# Patient Record
Sex: Female | Born: 1945 | Race: White | Hispanic: No | Smoking: Never smoker
Health system: Southern US, Community
[De-identification: ages and names within clinical notes are randomized; demographics above are authoritative.]

## PROBLEM LIST (undated history)

## (undated) DIAGNOSIS — M8580 Other specified disorders of bone density and structure, unspecified site: Secondary | ICD-10-CM

## (undated) DIAGNOSIS — C801 Malignant (primary) neoplasm, unspecified: Secondary | ICD-10-CM

## (undated) DIAGNOSIS — M1990 Unspecified osteoarthritis, unspecified site: Secondary | ICD-10-CM

## (undated) DIAGNOSIS — C50919 Malignant neoplasm of unspecified site of unspecified female breast: Secondary | ICD-10-CM

## (undated) DIAGNOSIS — K5790 Diverticulosis of intestine, part unspecified, without perforation or abscess without bleeding: Secondary | ICD-10-CM

## (undated) HISTORY — PX: ABDOMINAL HYSTERECTOMY: SHX81

## (undated) HISTORY — PX: OTHER SURGICAL HISTORY: SHX169

## (undated) HISTORY — DX: Malignant (primary) neoplasm, unspecified: C80.1

---

## 1997-12-03 ENCOUNTER — Ambulatory Visit (HOSPITAL_BASED_OUTPATIENT_CLINIC_OR_DEPARTMENT_OTHER): Admission: RE | Admit: 1997-12-03 | Discharge: 1997-12-03 | Payer: Self-pay | Admitting: Surgery

## 2006-05-08 HISTORY — PX: BREAST LUMPECTOMY: SHX2

## 2006-05-17 ENCOUNTER — Encounter: Admission: RE | Admit: 2006-05-17 | Discharge: 2006-05-17 | Payer: Self-pay | Admitting: Obstetrics and Gynecology

## 2006-05-18 ENCOUNTER — Encounter: Admission: RE | Admit: 2006-05-18 | Discharge: 2006-05-18 | Payer: Self-pay | Admitting: Obstetrics and Gynecology

## 2006-05-25 ENCOUNTER — Encounter (INDEPENDENT_AMBULATORY_CARE_PROVIDER_SITE_OTHER): Payer: Self-pay | Admitting: Surgery

## 2006-05-25 ENCOUNTER — Encounter (INDEPENDENT_AMBULATORY_CARE_PROVIDER_SITE_OTHER): Payer: Self-pay | Admitting: *Deleted

## 2006-05-25 ENCOUNTER — Ambulatory Visit (HOSPITAL_COMMUNITY): Admission: RE | Admit: 2006-05-25 | Discharge: 2006-05-25 | Payer: Self-pay | Admitting: Surgery

## 2006-05-25 ENCOUNTER — Encounter: Admission: RE | Admit: 2006-05-25 | Discharge: 2006-05-25 | Payer: Self-pay | Admitting: Surgery

## 2006-06-08 ENCOUNTER — Ambulatory Visit (HOSPITAL_BASED_OUTPATIENT_CLINIC_OR_DEPARTMENT_OTHER): Admission: RE | Admit: 2006-06-08 | Discharge: 2006-06-08 | Payer: Self-pay | Admitting: Surgery

## 2006-06-08 ENCOUNTER — Encounter (INDEPENDENT_AMBULATORY_CARE_PROVIDER_SITE_OTHER): Payer: Self-pay | Admitting: Specialist

## 2006-06-14 ENCOUNTER — Ambulatory Visit: Payer: Self-pay | Admitting: Oncology

## 2006-06-18 ENCOUNTER — Ambulatory Visit: Admission: RE | Admit: 2006-06-18 | Discharge: 2006-08-22 | Payer: Self-pay | Admitting: Radiation Oncology

## 2006-07-03 LAB — CBC WITH DIFFERENTIAL/PLATELET
EOS%: 0.5 % (ref 0.0–7.0)
MCH: 32.1 pg (ref 26.0–34.0)
MCV: 91.7 fL (ref 81.0–101.0)
MONO%: 13.4 % — ABNORMAL HIGH (ref 0.0–13.0)
RBC: 4.45 10*6/uL (ref 3.70–5.32)
RDW: 13.1 % (ref 11.3–14.5)

## 2006-07-03 LAB — COMPREHENSIVE METABOLIC PANEL
AST: 21 U/L (ref 0–37)
Albumin: 4.4 g/dL (ref 3.5–5.2)
Alkaline Phosphatase: 70 U/L (ref 39–117)
BUN: 13 mg/dL (ref 6–23)
Potassium: 4.4 mEq/L (ref 3.5–5.3)
Sodium: 142 mEq/L (ref 135–145)
Total Bilirubin: 0.4 mg/dL (ref 0.3–1.2)
Total Protein: 7 g/dL (ref 6.0–8.3)

## 2006-08-28 ENCOUNTER — Ambulatory Visit: Payer: Self-pay | Admitting: Oncology

## 2006-12-11 ENCOUNTER — Ambulatory Visit: Payer: Self-pay | Admitting: Oncology

## 2006-12-14 LAB — CBC WITH DIFFERENTIAL/PLATELET
Eosinophils Absolute: 0 10*3/uL (ref 0.0–0.5)
HCT: 39.4 % (ref 34.8–46.6)
LYMPH%: 17.2 % (ref 14.0–48.0)
MCV: 93.5 fL (ref 81.0–101.0)
MONO%: 10.1 % (ref 0.0–13.0)
NEUT#: 3.6 10*3/uL (ref 1.5–6.5)
NEUT%: 71.6 % (ref 39.6–76.8)
Platelets: 197 10*3/uL (ref 145–400)
RBC: 4.22 10*6/uL (ref 3.70–5.32)

## 2006-12-14 LAB — LACTATE DEHYDROGENASE: LDH: 152 U/L (ref 94–250)

## 2006-12-14 LAB — COMPREHENSIVE METABOLIC PANEL
Alkaline Phosphatase: 49 U/L (ref 39–117)
BUN: 17 mg/dL (ref 6–23)
CO2: 23 mEq/L (ref 19–32)
Creatinine, Ser: 0.71 mg/dL (ref 0.40–1.20)
Glucose, Bld: 96 mg/dL (ref 70–99)
Sodium: 142 mEq/L (ref 135–145)
Total Bilirubin: 0.4 mg/dL (ref 0.3–1.2)

## 2007-01-28 ENCOUNTER — Encounter: Admission: RE | Admit: 2007-01-28 | Discharge: 2007-01-28 | Payer: Self-pay | Admitting: Surgery

## 2007-04-11 ENCOUNTER — Encounter: Admission: RE | Admit: 2007-04-11 | Discharge: 2007-04-11 | Payer: Self-pay | Admitting: Surgery

## 2007-09-09 ENCOUNTER — Ambulatory Visit: Payer: Self-pay | Admitting: Oncology

## 2007-09-11 LAB — CBC WITH DIFFERENTIAL/PLATELET
BASO%: 0.2 % (ref 0.0–2.0)
LYMPH%: 17.1 % (ref 14.0–48.0)
MCHC: 34.3 g/dL (ref 32.0–36.0)
MCV: 93.1 fL (ref 81.0–101.0)
MONO%: 7.8 % (ref 0.0–13.0)
Platelets: 202 10*3/uL (ref 145–400)
RBC: 4.28 10*6/uL (ref 3.70–5.32)

## 2007-09-11 LAB — COMPREHENSIVE METABOLIC PANEL
ALT: 15 U/L (ref 0–35)
Alkaline Phosphatase: 47 U/L (ref 39–117)
Sodium: 143 mEq/L (ref 135–145)
Total Bilirubin: 0.3 mg/dL (ref 0.3–1.2)
Total Protein: 6.8 g/dL (ref 6.0–8.3)

## 2008-01-14 ENCOUNTER — Encounter: Admission: RE | Admit: 2008-01-14 | Discharge: 2008-01-14 | Payer: Self-pay | Admitting: Family Medicine

## 2008-03-25 ENCOUNTER — Ambulatory Visit: Payer: Self-pay | Admitting: Internal Medicine

## 2008-04-13 ENCOUNTER — Encounter: Admission: RE | Admit: 2008-04-13 | Discharge: 2008-04-13 | Payer: Self-pay | Admitting: Surgery

## 2008-04-15 ENCOUNTER — Ambulatory Visit: Payer: Self-pay | Admitting: Internal Medicine

## 2008-09-23 ENCOUNTER — Ambulatory Visit: Payer: Self-pay | Admitting: Oncology

## 2008-09-25 LAB — CBC WITH DIFFERENTIAL/PLATELET
BASO%: 0.3 % (ref 0.0–2.0)
Basophils Absolute: 0 10*3/uL (ref 0.0–0.1)
LYMPH%: 23 % (ref 14.0–49.7)
MCH: 31.9 pg (ref 25.1–34.0)
MONO#: 0.5 10*3/uL (ref 0.1–0.9)
MONO%: 8.1 % (ref 0.0–14.0)
RDW: 12.8 % (ref 11.2–14.5)

## 2008-09-28 LAB — LACTATE DEHYDROGENASE: LDH: 142 U/L (ref 94–250)

## 2008-09-28 LAB — COMPREHENSIVE METABOLIC PANEL
ALT: 12 U/L (ref 0–35)
AST: 17 U/L (ref 0–37)
Albumin: 4 g/dL (ref 3.5–5.2)
BUN: 20 mg/dL (ref 6–23)
Calcium: 9.3 mg/dL (ref 8.4–10.5)
Chloride: 107 mEq/L (ref 96–112)
Potassium: 4 mEq/L (ref 3.5–5.3)

## 2008-09-28 LAB — VITAMIN D 25 HYDROXY (VIT D DEFICIENCY, FRACTURES): Vit D, 25-Hydroxy: 56 ng/mL (ref 30–89)

## 2009-04-14 ENCOUNTER — Encounter: Admission: RE | Admit: 2009-04-14 | Discharge: 2009-04-14 | Payer: Self-pay | Admitting: Oncology

## 2009-09-09 ENCOUNTER — Ambulatory Visit: Payer: Self-pay | Admitting: Oncology

## 2009-09-10 LAB — COMPREHENSIVE METABOLIC PANEL
ALT: 14 U/L (ref 0–35)
Alkaline Phosphatase: 51 U/L (ref 39–117)
BUN: 18 mg/dL (ref 6–23)
Calcium: 9 mg/dL (ref 8.4–10.5)
Chloride: 105 mEq/L (ref 96–112)
Creatinine, Ser: 0.71 mg/dL (ref 0.40–1.20)
Glucose, Bld: 130 mg/dL — ABNORMAL HIGH (ref 70–99)
Sodium: 142 mEq/L (ref 135–145)
Total Bilirubin: 0.3 mg/dL (ref 0.3–1.2)

## 2009-09-10 LAB — CBC WITH DIFFERENTIAL/PLATELET
EOS%: 0.4 % (ref 0.0–7.0)
HGB: 13.4 g/dL (ref 11.6–15.9)
MCH: 31.9 pg (ref 25.1–34.0)
MCHC: 33.8 g/dL (ref 31.5–36.0)
MCV: 94.4 fL (ref 79.5–101.0)
MONO%: 6.2 % (ref 0.0–14.0)
NEUT#: 5.2 10*3/uL (ref 1.5–6.5)
lymph#: 1.3 10*3/uL (ref 0.9–3.3)

## 2010-04-15 ENCOUNTER — Encounter
Admission: RE | Admit: 2010-04-15 | Discharge: 2010-04-15 | Payer: Self-pay | Source: Home / Self Care | Attending: Oncology | Admitting: Oncology

## 2010-05-29 ENCOUNTER — Encounter: Payer: Self-pay | Admitting: Oncology

## 2010-05-29 ENCOUNTER — Encounter: Payer: Self-pay | Admitting: Obstetrics and Gynecology

## 2010-06-09 ENCOUNTER — Emergency Department (HOSPITAL_COMMUNITY): Payer: BC Managed Care – PPO

## 2010-06-09 ENCOUNTER — Emergency Department (HOSPITAL_COMMUNITY)
Admission: EM | Admit: 2010-06-09 | Discharge: 2010-06-09 | Disposition: A | Discharge status: Home / Self Care | Payer: BC Managed Care – PPO | Attending: Emergency Medicine | Admitting: Emergency Medicine

## 2010-06-09 DIAGNOSIS — W108XXA Fall (on) (from) other stairs and steps, initial encounter: Secondary | ICD-10-CM | POA: Insufficient documentation

## 2010-06-09 DIAGNOSIS — S42293A Other displaced fracture of upper end of unspecified humerus, initial encounter for closed fracture: Secondary | ICD-10-CM | POA: Insufficient documentation

## 2010-06-09 DIAGNOSIS — M79609 Pain in unspecified limb: Secondary | ICD-10-CM | POA: Insufficient documentation

## 2010-06-09 DIAGNOSIS — Y92009 Unspecified place in unspecified non-institutional (private) residence as the place of occurrence of the external cause: Secondary | ICD-10-CM | POA: Insufficient documentation

## 2010-09-03 ENCOUNTER — Encounter (INDEPENDENT_AMBULATORY_CARE_PROVIDER_SITE_OTHER): Payer: Self-pay | Admitting: Surgery

## 2010-09-23 ENCOUNTER — Other Ambulatory Visit: Payer: Self-pay | Admitting: Oncology

## 2010-09-23 ENCOUNTER — Encounter (HOSPITAL_BASED_OUTPATIENT_CLINIC_OR_DEPARTMENT_OTHER): Payer: BC Managed Care – PPO | Admitting: Oncology

## 2010-09-23 DIAGNOSIS — Z170 Estrogen receptor positive status [ER+]: Secondary | ICD-10-CM

## 2010-09-23 DIAGNOSIS — D0590 Unspecified type of carcinoma in situ of unspecified breast: Secondary | ICD-10-CM

## 2010-09-23 LAB — CBC WITH DIFFERENTIAL/PLATELET
BASO%: 0.5 % (ref 0.0–2.0)
EOS%: 0.4 % (ref 0.0–7.0)
Eosinophils Absolute: 0 10*3/uL (ref 0.0–0.5)
HGB: 13.4 g/dL (ref 11.6–15.9)
MCHC: 34.4 g/dL (ref 31.5–36.0)
MCV: 91.5 fL (ref 79.5–101.0)
MONO#: 0.5 10*3/uL (ref 0.1–0.9)
NEUT#: 4.5 10*3/uL (ref 1.5–6.5)
Platelets: 174 10*3/uL (ref 145–400)
RBC: 4.27 10*6/uL (ref 3.70–5.45)
RDW: 13.4 % (ref 11.2–14.5)
lymph#: 1.3 10*3/uL (ref 0.9–3.3)

## 2010-09-23 NOTE — Op Note (Signed)
NAMEAUSTYN, Samantha Pineda NO.:  [id number]   MEDICAL RECORD NO.:  [id number]          PATIENT TYPE:  AMB   LOCATION:  DSC                          FACILITY:  [facility]   PHYSICIAN:  Velora Heckler, MD      DATE OF BIRTH:  05/30/1945   DATE OF PROCEDURE:  06/08/2006  DATE OF DISCHARGE:                               OPERATIVE REPORT   PREOPERATIVE DIAGNOSIS:  Ductal carcinoma in situ, right breast.   POSTOPERATIVE DIAGNOSIS:  Ductal carcinoma in situ, right breast.   PROCEDURE:  Re-excision, right breast biopsy site (right breast  lumpectomy).   SURGEON:  Velora Heckler, MD, FACS   ANESTHESIA:  General.   ESTIMATED BLOOD LOSS:  Minimal.   PREPARATION:  Betadine.   COMPLICATIONS:  None.   INDICATIONS:  The patient is a 65 year old white female who recently  underwent wire-localized excisional biopsy for abnormality on mammogram.  Pathology reveals ductal carcinoma in situ.  Margins were positive.  The  patient now returns for re-excision.   BODY OF REPORT:  The procedure is done in OR #3 at the [facility].  The patient is brought to the operating room and placed  in a supine position on the operating room table.  Following  administration of general anesthesia, the patient is prepped and draped  in the usual strict aseptic fashion.  After ascertaining that an  adequate level of anesthesia had been obtained, the patient's previous  right breast upper outer quadrant incision is excised with a #15 blade.  Hemostasis is obtained with the electrocautery.  Skin edges are  discarded.  Seroma cavity is opened and evacuated.  Using the  electrocautery, the deep margin of the cavity is excised off the  underlying pectoralis major muscle, including the muscle fascia.  This  was submitted as a separate specimen labeled as deep margin of seroma  cavity.  Next using the electrocautery, a rim of breast tissue is  excised beginning medially and extending around  the seroma cavity 270  degrees from medial to inferior to lateral.  Approximately 1.5-2 cm of  tissue are taken along the rim.  This is marked after excision with  sutures, with a single short suture marking the medial margin, a double  short suture marking the inferior margin, and a small single suture  marking the lateral margin.  No remaining breast tissue exists superior  to the seroma cavity.  Margins of the seroma cavity are then marked with  medium Ligaclips, including two clips located centrally at the site of  previous needle-localized biopsy.  Good hemostasis is achieved.  Subcutaneous tissues are closed with interrupted 3-0 Vicryl sutures.  Skin is closed with a  running 4-0 Vicryl subcuticular suture.  Wound is washed and dried and  Benzoin and Steri-Strips were applied.  Sterile fluff gauze dressings  are applied.  The patient is awakened from anesthesia and brought to the  recovery room in stable condition.  The patient tolerated the procedure  well.      Velora Heckler, MD  Electronically Signed  TMG/MEDQ  D:  06/08/2006  T:  06/08/2006  Job:  [id number]   cc:   S. Kyra Manges, M.D.  Caron L. Kearney Hard, M.D.

## 2010-09-24 LAB — LACTATE DEHYDROGENASE: LDH: 159 U/L (ref 94–250)

## 2010-09-24 LAB — COMPREHENSIVE METABOLIC PANEL
ALT: 15 U/L (ref 0–35)
Alkaline Phosphatase: 60 U/L (ref 39–117)
CO2: 22 mEq/L (ref 19–32)
Calcium: 9.1 mg/dL (ref 8.4–10.5)
Creatinine, Ser: 0.64 mg/dL (ref 0.40–1.20)
Total Bilirubin: 0.2 mg/dL — ABNORMAL LOW (ref 0.3–1.2)
Total Protein: 6.2 g/dL (ref 6.0–8.3)

## 2010-09-30 ENCOUNTER — Encounter (INDEPENDENT_AMBULATORY_CARE_PROVIDER_SITE_OTHER): Payer: Self-pay | Admitting: Surgery

## 2010-09-30 ENCOUNTER — Encounter (HOSPITAL_BASED_OUTPATIENT_CLINIC_OR_DEPARTMENT_OTHER): Payer: BC Managed Care – PPO | Admitting: Oncology

## 2010-09-30 DIAGNOSIS — D0590 Unspecified type of carcinoma in situ of unspecified breast: Secondary | ICD-10-CM

## 2010-09-30 DIAGNOSIS — Z170 Estrogen receptor positive status [ER+]: Secondary | ICD-10-CM

## 2010-10-04 ENCOUNTER — Other Ambulatory Visit: Payer: Self-pay | Admitting: Oncology

## 2010-10-04 DIAGNOSIS — Z853 Personal history of malignant neoplasm of breast: Secondary | ICD-10-CM

## 2011-01-12 ENCOUNTER — Other Ambulatory Visit: Payer: Self-pay | Admitting: Family Medicine

## 2011-01-12 DIAGNOSIS — M8580 Other specified disorders of bone density and structure, unspecified site: Secondary | ICD-10-CM

## 2011-01-13 ENCOUNTER — Other Ambulatory Visit: Payer: Self-pay | Admitting: Podiatrist

## 2011-01-13 DIAGNOSIS — M79671 Pain in right foot: Secondary | ICD-10-CM

## 2011-01-20 ENCOUNTER — Ambulatory Visit
Admission: RE | Admit: 2011-01-20 | Discharge: 2011-01-20 | Disposition: A | Discharge status: Home / Self Care | Payer: BC Managed Care – PPO | Source: Ambulatory Visit | Attending: Podiatrist | Admitting: Podiatrist

## 2011-01-20 DIAGNOSIS — M79671 Pain in right foot: Secondary | ICD-10-CM

## 2011-01-20 MED ORDER — GADOBENATE DIMEGLUMINE 529 MG/ML IV SOLN
5.0000 mL | Freq: Once | INTRAVENOUS | Status: AC | PRN
  Administered 2011-01-20: 5 mL via INTRAVENOUS

## 2011-03-01 ENCOUNTER — Ambulatory Visit
Admission: RE | Admit: 2011-03-01 | Discharge: 2011-03-01 | Disposition: A | Discharge status: Home / Self Care | Payer: BC Managed Care – PPO | Source: Ambulatory Visit | Attending: Family Medicine | Admitting: Family Medicine

## 2011-03-01 DIAGNOSIS — M8580 Other specified disorders of bone density and structure, unspecified site: Secondary | ICD-10-CM

## 2011-04-17 ENCOUNTER — Encounter (INDEPENDENT_AMBULATORY_CARE_PROVIDER_SITE_OTHER): Payer: Self-pay | Admitting: Surgery

## 2011-04-17 ENCOUNTER — Ambulatory Visit
Admission: RE | Admit: 2011-04-17 | Discharge: 2011-04-17 | Disposition: A | Discharge status: Home / Self Care | Payer: BC Managed Care – PPO | Source: Ambulatory Visit | Attending: Oncology | Admitting: Oncology

## 2011-04-17 ENCOUNTER — Encounter (INDEPENDENT_AMBULATORY_CARE_PROVIDER_SITE_OTHER): Payer: Self-pay

## 2011-04-17 DIAGNOSIS — Z853 Personal history of malignant neoplasm of breast: Secondary | ICD-10-CM

## 2011-04-18 ENCOUNTER — Ambulatory Visit (INDEPENDENT_AMBULATORY_CARE_PROVIDER_SITE_OTHER): Payer: BC Managed Care – PPO | Admitting: Surgery

## 2011-04-18 ENCOUNTER — Encounter (INDEPENDENT_AMBULATORY_CARE_PROVIDER_SITE_OTHER): Payer: Self-pay | Admitting: Surgery

## 2011-04-18 VITALS — BP 116/74 | HR 60 | Temp 98.4°F | Resp 18 | Ht 63.0 in | Wt 106.0 lb

## 2011-04-18 DIAGNOSIS — D0590 Unspecified type of carcinoma in situ of unspecified breast: Secondary | ICD-10-CM

## 2011-04-18 DIAGNOSIS — D0510 Intraductal carcinoma in situ of unspecified breast: Secondary | ICD-10-CM

## 2011-04-18 NOTE — Progress Notes (Signed)
Visit Diagnoses: 1. DCIS (ductal carcinoma in situ), right breast     HISTORY: Patient is a 65 year old white female with a history of ductal carcinoma in situ in the right breast. She underwent partial mastectomy in 2008. A followup mammogram was performed today. This shows no sign of recurrent disease. There were no worrisome findings for malignancy.   PERTINENT REVIEW OF SYSTEMS: Patient denies any changes in self-examination. No new masses. No nipple discharge. No lymphadenopathy.   EXAM: HEENT: normocephalic; pupils equal and reactive; sclerae clear; dentition good; mucous membranes moist NECK:  No nodules; symmetric on extension; no palpable anterior or posterior cervical lymphadenopathy; no supraclavicular masses; no tenderness CHEST: clear to auscultation bilaterally without rales, rhonchi, or wheezes CARDIAC: regular rate and rhythm without significant murmur; peripheral pulses are full BREAST: Right breast with normal nipple areolar complex. Surgical incision in the upper outer quadrant as well healed. There is mild to moderate tenderness at the site. There are no discrete or dominant masses. Axilla is free of adenopathy. Left breast shows normal nipple areolar complex. No nipple drainage. Breast parenchyma is soft and uniform without discrete or dominant mass. Axilla is free of adenopathy. EXT:  non-tender without edema; no deformity NEURO: no gross focal deficits; no sign of tremor   IMPRESSION: Personal history of ductal carcinoma in situ, right breast, no evidence of recurrent disease   PLAN: Patient will continue monthly self-examination. She will see her oncologist in the spring. She is on tamoxifen and will continue that until May of 2013. She will return for followup mammogram in one year. She will return for followup breast exam at that time.  Velora Heckler, MD, FACS General & Endocrine Surgery [facility]

## 2011-09-15 ENCOUNTER — Other Ambulatory Visit: Payer: Self-pay | Admitting: Oncology

## 2011-09-15 DIAGNOSIS — C50919 Malignant neoplasm of unspecified site of unspecified female breast: Secondary | ICD-10-CM

## 2011-09-20 ENCOUNTER — Telehealth: Payer: Self-pay | Admitting: *Deleted

## 2011-09-20 NOTE — Telephone Encounter (Signed)
patient confirmed over the phone the new date and time 

## 2011-09-28 ENCOUNTER — Other Ambulatory Visit (HOSPITAL_BASED_OUTPATIENT_CLINIC_OR_DEPARTMENT_OTHER): Payer: BC Managed Care – PPO | Admitting: Lab

## 2011-09-28 DIAGNOSIS — Z170 Estrogen receptor positive status [ER+]: Secondary | ICD-10-CM

## 2011-09-28 DIAGNOSIS — D0590 Unspecified type of carcinoma in situ of unspecified breast: Secondary | ICD-10-CM

## 2011-09-28 LAB — CBC WITH DIFFERENTIAL/PLATELET
EOS%: 0.3 % (ref 0.0–7.0)
Eosinophils Absolute: 0 10*3/uL (ref 0.0–0.5)
MCV: 92.7 fL (ref 79.5–101.0)
MONO%: 6.7 % (ref 0.0–14.0)
NEUT#: 5.7 10*3/uL (ref 1.5–6.5)
RBC: 4.17 10*6/uL (ref 3.70–5.45)
RDW: 13.4 % (ref 11.2–14.5)
lymph#: 1.1 10*3/uL (ref 0.9–3.3)
nRBC: 0 % (ref 0–0)

## 2011-09-29 LAB — COMPREHENSIVE METABOLIC PANEL
ALT: 12 U/L (ref 0–35)
Alkaline Phosphatase: 48 U/L (ref 39–117)
CO2: 27 mEq/L (ref 19–32)
Potassium: 3.9 mEq/L (ref 3.5–5.3)
Sodium: 141 mEq/L (ref 135–145)
Total Bilirubin: 0.3 mg/dL (ref 0.3–1.2)
Total Protein: 6.3 g/dL (ref 6.0–8.3)

## 2011-10-05 ENCOUNTER — Ambulatory Visit (HOSPITAL_BASED_OUTPATIENT_CLINIC_OR_DEPARTMENT_OTHER): Payer: BC Managed Care – PPO | Admitting: Oncology

## 2011-10-05 ENCOUNTER — Telehealth: Payer: Self-pay | Admitting: Oncology

## 2011-10-05 VITALS — BP 128/83 | HR 103 | Temp 98.9°F | Ht 63.0 in | Wt 104.1 lb

## 2011-10-05 DIAGNOSIS — Z853 Personal history of malignant neoplasm of breast: Secondary | ICD-10-CM

## 2011-10-05 DIAGNOSIS — D0510 Intraductal carcinoma in situ of unspecified breast: Secondary | ICD-10-CM

## 2011-10-05 NOTE — Progress Notes (Signed)
Hematology and Oncology Follow Up Visit  Samantha Pineda [id number] Jan 14, 1946 66 y.o. 10/05/2011 2:43 PM   DIAGNOSIS: DCIS on tamoxifen  No diagnosis found.   PAST THERAPY: lumpectomy 06/08/06; xrt completed 08/15/06   Interim History:  Doing well, still unemployed. Otherwise no complaints. No other health related issues.   Medications: I have reviewed the patient's current medications.  Allergies:  Allergies  Allergen Reactions  . Codeine     REACTION: nausea    Past Medical History, Surgical history, Social history, and Family History were reviewed and updated.  Review of Systems: Constitutional:  Negative for fever, chills, night sweats, anorexia, weight loss, pain. Cardiovascular: no chest pain or dyspnea on exertion Respiratory: no cough, shortness of breath, or wheezing Neurological: no TIA or stroke symptoms Dermatological: negative ENT: negative Skin Gastrointestinal: no abdominal pain, change in bowel habits, or black or bloody stools Genito-Urinary: negative Hematological and Lymphatic: negative Breast: negative Musculoskeletal: negative Remaining ROS negative.  Physical Exam:  Blood pressure 128/83, pulse 103, temperature 98.9 F (37.2 C), temperature source Oral, height 5\' 3"  (1.6 m), weight 104 lb 1.6 oz (47.219 kg).  ECOG: 0   General appearance: alert, cooperative and appears stated age Throat: lips, mucosa, and tongue normal; teeth and gums normal Resp: clear to auscultation bilaterally Breasts: normal appearance, no masses or tenderness Cardio: regular rate and rhythm, S1, S2 normal, no murmur, click, rub or gallop GI: soft, non-tender; bowel sounds normal; no masses,  no organomegaly  Extremities: extremities normal, atraumatic, no cyanosis or edema Pulses: 2+ and symmetric Lymph nodes: Cervical, supraclavicular, and axillary nodes normal. Neurologic: Grossly normal   Lab Results: Lab Results  Component Value Date   WBC 7.3 09/28/2011   HGB 12.9 09/28/2011   HCT 38.7 09/28/2011   MCV 92.7 09/28/2011   PLT 162 09/28/2011     Chemistry      Component Value Date/Time   NA 141 09/28/2011 1147   K 3.9 09/28/2011 1147   CL 104 09/28/2011 1147   CO2 27 09/28/2011 1147   BUN 13 09/28/2011 1147   CREATININE 0.68 09/28/2011 1147      Component Value Date/Time   CALCIUM 9.2 09/28/2011 1147   ALKPHOS 48 09/28/2011 1147   AST 20 09/28/2011 1147   ALT 12 09/28/2011 1147   BILITOT 0.3 09/28/2011 1147       Radiological Studies:  No results found.   IMPRESSIONS AND PLAN: A 66 y.o. female with hx of er+ dcis, s/p surgery, xrt and 5 yr of tamoxifen. We discussed the pros and cons of staying on another 5 yrs of tamoxifen. She has agreed to stop at this point. I will see her in 1 yr.     Spent more than half the time coordinating care.    Ebone Alcivar 5/30/20132:43 PM

## 2011-10-05 NOTE — Telephone Encounter (Signed)
gve the pt her may,june 2014 appt calendar °

## 2011-12-05 ENCOUNTER — Other Ambulatory Visit: Payer: Self-pay | Admitting: Oncology

## 2011-12-05 DIAGNOSIS — C50919 Malignant neoplasm of unspecified site of unspecified female breast: Secondary | ICD-10-CM

## 2012-02-12 ENCOUNTER — Other Ambulatory Visit: Payer: Self-pay | Admitting: Oncology

## 2012-02-12 DIAGNOSIS — Z853 Personal history of malignant neoplasm of breast: Secondary | ICD-10-CM

## 2012-02-12 DIAGNOSIS — Z9889 Other specified postprocedural states: Secondary | ICD-10-CM

## 2012-04-17 ENCOUNTER — Ambulatory Visit
Admission: RE | Admit: 2012-04-17 | Discharge: 2012-04-17 | Disposition: A | Discharge status: Home / Self Care | Payer: BC Managed Care – PPO | Source: Ambulatory Visit | Attending: Oncology | Admitting: Oncology

## 2012-04-17 DIAGNOSIS — Z9889 Other specified postprocedural states: Secondary | ICD-10-CM

## 2012-04-17 DIAGNOSIS — Z853 Personal history of malignant neoplasm of breast: Secondary | ICD-10-CM

## 2012-04-22 ENCOUNTER — Ambulatory Visit (INDEPENDENT_AMBULATORY_CARE_PROVIDER_SITE_OTHER): Payer: BC Managed Care – PPO | Admitting: Surgery

## 2012-04-22 ENCOUNTER — Encounter (INDEPENDENT_AMBULATORY_CARE_PROVIDER_SITE_OTHER): Payer: Self-pay | Admitting: Surgery

## 2012-04-22 VITALS — BP 122/80 | HR 72 | Temp 98.2°F | Resp 18 | Ht 62.0 in | Wt 105.0 lb

## 2012-04-22 DIAGNOSIS — D0510 Intraductal carcinoma in situ of unspecified breast: Secondary | ICD-10-CM

## 2012-04-22 DIAGNOSIS — D0590 Unspecified type of carcinoma in situ of unspecified breast: Secondary | ICD-10-CM

## 2012-04-22 NOTE — Progress Notes (Signed)
General Surgery [facility]  Visit Diagnoses: 1. DCIS (ductal carcinoma in situ), right breast     HISTORY: Patient is a 66 yo WF with history of DCIS involving the right breast excised 5 years ago.  She has had no evidence of recurrence. She completed 5 years of treatment on tamoxifen in May of 2013. She underwent annual mammogram on 04/17/2012. This was negative for any evidence of malignancy.  PERTINENT REVIEW OF SYSTEMS: Patient continues to note significant sensitivity at the site of her right breast scar. She denies nipple discharge. She denies any new masses.  EXAM: HEENT: normocephalic; pupils equal and reactive; sclerae clear; dentition good; mucous membranes moist NECK:  symmetric on extension; no palpable anterior or posterior cervical lymphadenopathy; no supraclavicular masses; no tenderness BREAST: Well-healed incision upper central portion right breast. No palpable masses in the right breast parenchyma. Nipple areolar complex is normal. Axilla is without adenopathy. Left breast shows normal nipple areolar complex. The breast parenchyma is soft and uniform without discrete or dominant mass. Left axilla is free of adenopathy. CHEST: clear to auscultation bilaterally without rales, rhonchi, or wheezes CARDIAC: regular rate and rhythm without significant murmur; peripheral pulses are full EXT:  non-tender without edema; no deformity NEURO: no gross focal deficits; no sign of tremor   IMPRESSION: #1 personal history of ductal carcinoma in situ right breast, no evidence of recurrence #2 benign breast examination  PLAN: Patient will be released from further surgical follow-up at this point. She will continue to see her primary care physician, had annual mammography, and perform self-examination. Patient will return as needed.  Velora Heckler, MD, [facility] Office: [phone]

## 2012-07-27 ENCOUNTER — Telehealth: Payer: Self-pay | Admitting: Oncology

## 2012-07-27 ENCOUNTER — Encounter: Payer: Self-pay | Admitting: Oncology

## 2012-08-13 ENCOUNTER — Telehealth: Payer: Self-pay | Admitting: Oncology

## 2012-08-13 NOTE — Telephone Encounter (Signed)
Called pt left message regarding lab on 5/27 move to 1 pm per pt rqst

## 2012-10-01 ENCOUNTER — Other Ambulatory Visit: Payer: Self-pay | Admitting: Emergency Medicine

## 2012-10-01 ENCOUNTER — Other Ambulatory Visit (HOSPITAL_BASED_OUTPATIENT_CLINIC_OR_DEPARTMENT_OTHER): Payer: BC Managed Care – PPO

## 2012-10-01 DIAGNOSIS — D0510 Intraductal carcinoma in situ of unspecified breast: Secondary | ICD-10-CM

## 2012-10-01 DIAGNOSIS — D0590 Unspecified type of carcinoma in situ of unspecified breast: Secondary | ICD-10-CM

## 2012-10-01 LAB — CBC WITH DIFFERENTIAL/PLATELET
Basophils Absolute: 0 10*3/uL (ref 0.0–0.1)
Eosinophils Absolute: 0 10*3/uL (ref 0.0–0.5)
HCT: 39.3 % (ref 34.8–46.6)
HGB: 12.9 g/dL (ref 11.6–15.9)
LYMPH%: 23.9 % (ref 14.0–49.7)
MCV: 90.8 fL (ref 79.5–101.0)
MONO%: 7.9 % (ref 0.0–14.0)
NEUT#: 3.8 10*3/uL (ref 1.5–6.5)
NEUT%: 66.7 % (ref 38.4–76.8)
Platelets: 174 10*3/uL (ref 145–400)

## 2012-10-01 LAB — COMPREHENSIVE METABOLIC PANEL (CC13)
Alkaline Phosphatase: 90 U/L (ref 40–150)
BUN: 17.4 mg/dL (ref 7.0–26.0)
CO2: 28 mEq/L (ref 22–29)
Glucose: 106 mg/dl — ABNORMAL HIGH (ref 70–99)
Total Bilirubin: 0.31 mg/dL (ref 0.20–1.20)

## 2012-10-08 ENCOUNTER — Ambulatory Visit: Payer: BC Managed Care – PPO | Admitting: Oncology

## 2012-10-09 ENCOUNTER — Encounter: Payer: Self-pay | Admitting: Oncology

## 2012-10-09 ENCOUNTER — Ambulatory Visit (HOSPITAL_BASED_OUTPATIENT_CLINIC_OR_DEPARTMENT_OTHER): Payer: Medicare Other | Admitting: Oncology

## 2012-10-09 VITALS — BP 120/83 | HR 96 | Temp 97.7°F | Resp 20 | Ht 62.0 in | Wt 105.9 lb

## 2012-10-09 DIAGNOSIS — D0511 Intraductal carcinoma in situ of right breast: Secondary | ICD-10-CM

## 2012-10-09 DIAGNOSIS — Z853 Personal history of malignant neoplasm of breast: Secondary | ICD-10-CM

## 2012-11-03 NOTE — Progress Notes (Signed)
OFFICE PROGRESS NOTE  CC  MCNEILL,WENDY, MD [address] [place] [address]  DIAGNOSIS: 67 year old female with history of DCIS of the right breast.  PRIOR THERAPY:  #1 patient underwent a right breast lumpectomy 5 years ago.  #2 she had radiation therapy followed by tamoxifen for 5 years which she completed in May 2013.  #3 she has been seeing Dr. Darnell Level and her most recent visit was in December 2013.  CURRENT THERAPY:observation  INTERVAL HISTORY: Samantha Pineda JANUARY 67 y.o. female returns for followup visit. She was last seen by Dr. Pierce Crane about a year ago. Clinically patient seems to be doing well and is without any complaints she denies any fevers chills night sweats headaches shortness of breath chest pains palpitations she has no evidence of local recurrence.  MEDICAL HISTORY: Past Medical History  Diagnosis Date  . Cancer     DCIS    ALLERGIES:  is allergic to codeine.  MEDICATIONS:  Current Outpatient Prescriptions  Medication Sig Dispense Refill  . Calcium Carbonate-Vitamin D (CALCIUM + D PO) Take 1 tablet by mouth 2 (two) times daily. 600 Calcium and 400iu of Vitamin D for each tablet      . cholecalciferol (VITAMIN D) 1000 UNITS tablet Take 1,000 Units by mouth daily.      . Multiple Vitamin (DAILY VITAMIN PO) Take 1,000 mg by mouth daily.         No current facility-administered medications for this visit.    SURGICAL HISTORY:  Past Surgical History  Procedure Laterality Date  . Breast lumpectomy  2008    dr. Gerrit Friends performed  . Tonsillectomy       REVIEW OF SYSTEMS:  Pertinent items are noted in HPI.   HEALTH MAINTENANCE:   PHYSICAL EXAMINATION: Blood pressure 120/83, pulse 96, temperature 97.7 F (36.5 C), temperature source Oral, resp. rate 20, height 5\' 2"  (1.575 m), weight 105 lb 14.4 oz (48.036 kg). Body mass index is 19.36 kg/(m^2). ECOG PERFORMANCE STATUS: 0 - Asymptomatic   General appearance: alert, cooperative and  appears stated age Resp: clear to auscultation bilaterally Cardio: regular rate and rhythm GI: soft, non-tender; bowel sounds normal; no masses,  no organomegaly Extremities: extremities normal, atraumatic, no cyanosis or edema Neurologic: Grossly normal Right breast reveals barely visible surgical scar, no masses nipple discharge or other skin changes Left breast no masses or nipple discharge.  LABORATORY DATA: Lab Results  Component Value Date   WBC 5.7 10/01/2012   HGB 12.9 10/01/2012   HCT 39.3 10/01/2012   MCV 90.8 10/01/2012   PLT 174 10/01/2012      Chemistry      Component Value Date/Time   NA 144 10/01/2012 1320   NA 141 09/28/2011 1147   K 4.0 10/01/2012 1320   K 3.9 09/28/2011 1147   CL 107 10/01/2012 1320   CL 104 09/28/2011 1147   CO2 28 10/01/2012 1320   CO2 27 09/28/2011 1147   BUN 17.4 10/01/2012 1320   BUN 13 09/28/2011 1147   CREATININE 0.8 10/01/2012 1320   CREATININE 0.68 09/28/2011 1147      Component Value Date/Time   CALCIUM 9.3 10/01/2012 1320   CALCIUM 9.2 09/28/2011 1147   ALKPHOS 90 10/01/2012 1320   ALKPHOS 48 09/28/2011 1147   AST 20 10/01/2012 1320   AST 20 09/28/2011 1147   ALT 16 10/01/2012 1320   ALT 12 09/28/2011 1147   BILITOT 0.31 10/01/2012 1320   BILITOT 0.3 09/28/2011 1147  RADIOGRAPHIC STUDIES:  No results found.  ASSESSMENT: 67 year old female with  #1 history of DCIS of the right breast diagnosed in 2009. Patient is without any evidence of recurrent disease. She completed radiation therapy followed by 5 years of tamoxifen overall she is doing well.   PLAN:   #1 at this time patient can be seen on as needed basis. She is very happy with this. She'll continue to be seen by her primary care physician for ongoing health care needs. I did remind her that she needs to continue doing self breast examinations as well as her annual mammograms.   All questions were answered. The patient knows to call the clinic with any problems, questions or  concerns. We can certainly see the patient much sooner if necessary.  I spent 25 minutes counseling the patient face to face. The total time spent in the appointment was 30 minutes.    Drue Second, MD Medical/Oncology [facility] [phone] (beeper) [phone] (Office)

## 2012-11-19 DIAGNOSIS — M999 Biomechanical lesion, unspecified: Secondary | ICD-10-CM | POA: Diagnosis not present

## 2012-11-19 DIAGNOSIS — M25559 Pain in unspecified hip: Secondary | ICD-10-CM | POA: Diagnosis not present

## 2012-11-19 DIAGNOSIS — M5137 Other intervertebral disc degeneration, lumbosacral region: Secondary | ICD-10-CM | POA: Diagnosis not present

## 2012-11-19 DIAGNOSIS — IMO0002 Reserved for concepts with insufficient information to code with codable children: Secondary | ICD-10-CM | POA: Diagnosis not present

## 2012-11-20 DIAGNOSIS — M999 Biomechanical lesion, unspecified: Secondary | ICD-10-CM | POA: Diagnosis not present

## 2012-11-20 DIAGNOSIS — M5137 Other intervertebral disc degeneration, lumbosacral region: Secondary | ICD-10-CM | POA: Diagnosis not present

## 2012-11-20 DIAGNOSIS — M25559 Pain in unspecified hip: Secondary | ICD-10-CM | POA: Diagnosis not present

## 2012-11-20 DIAGNOSIS — IMO0002 Reserved for concepts with insufficient information to code with codable children: Secondary | ICD-10-CM | POA: Diagnosis not present

## 2012-11-21 DIAGNOSIS — M25559 Pain in unspecified hip: Secondary | ICD-10-CM | POA: Diagnosis not present

## 2012-11-21 DIAGNOSIS — M999 Biomechanical lesion, unspecified: Secondary | ICD-10-CM | POA: Diagnosis not present

## 2012-11-21 DIAGNOSIS — IMO0002 Reserved for concepts with insufficient information to code with codable children: Secondary | ICD-10-CM | POA: Diagnosis not present

## 2012-11-21 DIAGNOSIS — M5137 Other intervertebral disc degeneration, lumbosacral region: Secondary | ICD-10-CM | POA: Diagnosis not present

## 2012-11-22 DIAGNOSIS — IMO0002 Reserved for concepts with insufficient information to code with codable children: Secondary | ICD-10-CM | POA: Diagnosis not present

## 2012-11-22 DIAGNOSIS — M5137 Other intervertebral disc degeneration, lumbosacral region: Secondary | ICD-10-CM | POA: Diagnosis not present

## 2012-11-22 DIAGNOSIS — M25559 Pain in unspecified hip: Secondary | ICD-10-CM | POA: Diagnosis not present

## 2012-11-22 DIAGNOSIS — M999 Biomechanical lesion, unspecified: Secondary | ICD-10-CM | POA: Diagnosis not present

## 2012-11-25 DIAGNOSIS — IMO0002 Reserved for concepts with insufficient information to code with codable children: Secondary | ICD-10-CM | POA: Diagnosis not present

## 2012-11-25 DIAGNOSIS — M25559 Pain in unspecified hip: Secondary | ICD-10-CM | POA: Diagnosis not present

## 2012-11-25 DIAGNOSIS — M5137 Other intervertebral disc degeneration, lumbosacral region: Secondary | ICD-10-CM | POA: Diagnosis not present

## 2012-11-25 DIAGNOSIS — M999 Biomechanical lesion, unspecified: Secondary | ICD-10-CM | POA: Diagnosis not present

## 2012-11-26 DIAGNOSIS — M25559 Pain in unspecified hip: Secondary | ICD-10-CM | POA: Diagnosis not present

## 2012-11-26 DIAGNOSIS — M5137 Other intervertebral disc degeneration, lumbosacral region: Secondary | ICD-10-CM | POA: Diagnosis not present

## 2012-11-26 DIAGNOSIS — M999 Biomechanical lesion, unspecified: Secondary | ICD-10-CM | POA: Diagnosis not present

## 2012-11-26 DIAGNOSIS — IMO0002 Reserved for concepts with insufficient information to code with codable children: Secondary | ICD-10-CM | POA: Diagnosis not present

## 2012-11-28 DIAGNOSIS — M5137 Other intervertebral disc degeneration, lumbosacral region: Secondary | ICD-10-CM | POA: Diagnosis not present

## 2012-11-28 DIAGNOSIS — M999 Biomechanical lesion, unspecified: Secondary | ICD-10-CM | POA: Diagnosis not present

## 2012-11-28 DIAGNOSIS — M25559 Pain in unspecified hip: Secondary | ICD-10-CM | POA: Diagnosis not present

## 2012-11-28 DIAGNOSIS — IMO0002 Reserved for concepts with insufficient information to code with codable children: Secondary | ICD-10-CM | POA: Diagnosis not present

## 2012-12-02 DIAGNOSIS — M25559 Pain in unspecified hip: Secondary | ICD-10-CM | POA: Diagnosis not present

## 2012-12-02 DIAGNOSIS — M5137 Other intervertebral disc degeneration, lumbosacral region: Secondary | ICD-10-CM | POA: Diagnosis not present

## 2012-12-02 DIAGNOSIS — IMO0002 Reserved for concepts with insufficient information to code with codable children: Secondary | ICD-10-CM | POA: Diagnosis not present

## 2012-12-02 DIAGNOSIS — M999 Biomechanical lesion, unspecified: Secondary | ICD-10-CM | POA: Diagnosis not present

## 2012-12-03 DIAGNOSIS — M25559 Pain in unspecified hip: Secondary | ICD-10-CM | POA: Diagnosis not present

## 2012-12-03 DIAGNOSIS — IMO0002 Reserved for concepts with insufficient information to code with codable children: Secondary | ICD-10-CM | POA: Diagnosis not present

## 2012-12-03 DIAGNOSIS — M999 Biomechanical lesion, unspecified: Secondary | ICD-10-CM | POA: Diagnosis not present

## 2012-12-03 DIAGNOSIS — M5137 Other intervertebral disc degeneration, lumbosacral region: Secondary | ICD-10-CM | POA: Diagnosis not present

## 2012-12-05 DIAGNOSIS — M25559 Pain in unspecified hip: Secondary | ICD-10-CM | POA: Diagnosis not present

## 2012-12-05 DIAGNOSIS — IMO0002 Reserved for concepts with insufficient information to code with codable children: Secondary | ICD-10-CM | POA: Diagnosis not present

## 2012-12-05 DIAGNOSIS — M5137 Other intervertebral disc degeneration, lumbosacral region: Secondary | ICD-10-CM | POA: Diagnosis not present

## 2012-12-05 DIAGNOSIS — M999 Biomechanical lesion, unspecified: Secondary | ICD-10-CM | POA: Diagnosis not present

## 2012-12-09 DIAGNOSIS — IMO0002 Reserved for concepts with insufficient information to code with codable children: Secondary | ICD-10-CM | POA: Diagnosis not present

## 2012-12-09 DIAGNOSIS — M999 Biomechanical lesion, unspecified: Secondary | ICD-10-CM | POA: Diagnosis not present

## 2012-12-09 DIAGNOSIS — M5137 Other intervertebral disc degeneration, lumbosacral region: Secondary | ICD-10-CM | POA: Diagnosis not present

## 2012-12-09 DIAGNOSIS — M25559 Pain in unspecified hip: Secondary | ICD-10-CM | POA: Diagnosis not present

## 2012-12-10 DIAGNOSIS — M999 Biomechanical lesion, unspecified: Secondary | ICD-10-CM | POA: Diagnosis not present

## 2012-12-10 DIAGNOSIS — M5137 Other intervertebral disc degeneration, lumbosacral region: Secondary | ICD-10-CM | POA: Diagnosis not present

## 2012-12-10 DIAGNOSIS — M25559 Pain in unspecified hip: Secondary | ICD-10-CM | POA: Diagnosis not present

## 2012-12-10 DIAGNOSIS — IMO0002 Reserved for concepts with insufficient information to code with codable children: Secondary | ICD-10-CM | POA: Diagnosis not present

## 2012-12-12 DIAGNOSIS — M5137 Other intervertebral disc degeneration, lumbosacral region: Secondary | ICD-10-CM | POA: Diagnosis not present

## 2012-12-12 DIAGNOSIS — M25559 Pain in unspecified hip: Secondary | ICD-10-CM | POA: Diagnosis not present

## 2012-12-12 DIAGNOSIS — IMO0002 Reserved for concepts with insufficient information to code with codable children: Secondary | ICD-10-CM | POA: Diagnosis not present

## 2012-12-12 DIAGNOSIS — M999 Biomechanical lesion, unspecified: Secondary | ICD-10-CM | POA: Diagnosis not present

## 2012-12-18 DIAGNOSIS — T1590XA Foreign body on external eye, part unspecified, unspecified eye, initial encounter: Secondary | ICD-10-CM | POA: Diagnosis not present

## 2013-01-09 DIAGNOSIS — D0590 Unspecified type of carcinoma in situ of unspecified breast: Secondary | ICD-10-CM | POA: Diagnosis not present

## 2013-01-09 DIAGNOSIS — M899 Disorder of bone, unspecified: Secondary | ICD-10-CM | POA: Diagnosis not present

## 2013-01-09 DIAGNOSIS — Z131 Encounter for screening for diabetes mellitus: Secondary | ICD-10-CM | POA: Diagnosis not present

## 2013-01-09 DIAGNOSIS — Z0000 Encounter for general adult medical examination without abnormal findings: Secondary | ICD-10-CM | POA: Diagnosis not present

## 2013-01-09 DIAGNOSIS — Z789 Other specified health status: Secondary | ICD-10-CM | POA: Diagnosis not present

## 2013-01-16 DIAGNOSIS — M899 Disorder of bone, unspecified: Secondary | ICD-10-CM | POA: Diagnosis not present

## 2013-01-16 DIAGNOSIS — Z23 Encounter for immunization: Secondary | ICD-10-CM | POA: Diagnosis not present

## 2013-01-16 DIAGNOSIS — N8111 Cystocele, midline: Secondary | ICD-10-CM | POA: Diagnosis not present

## 2013-01-16 DIAGNOSIS — Z0000 Encounter for general adult medical examination without abnormal findings: Secondary | ICD-10-CM | POA: Diagnosis not present

## 2013-01-16 DIAGNOSIS — Z01419 Encounter for gynecological examination (general) (routine) without abnormal findings: Secondary | ICD-10-CM | POA: Diagnosis not present

## 2013-01-16 DIAGNOSIS — Z1331 Encounter for screening for depression: Secondary | ICD-10-CM | POA: Diagnosis not present

## 2013-01-16 DIAGNOSIS — D0590 Unspecified type of carcinoma in situ of unspecified breast: Secondary | ICD-10-CM | POA: Diagnosis not present

## 2013-01-20 ENCOUNTER — Other Ambulatory Visit: Payer: Self-pay | Admitting: Family Medicine

## 2013-01-20 DIAGNOSIS — M8580 Other specified disorders of bone density and structure, unspecified site: Secondary | ICD-10-CM

## 2013-02-03 ENCOUNTER — Other Ambulatory Visit: Payer: Self-pay | Admitting: Family Medicine

## 2013-02-03 ENCOUNTER — Ambulatory Visit
Admission: RE | Admit: 2013-02-03 | Discharge: 2013-02-03 | Disposition: A | Discharge status: Home / Self Care | Payer: Medicare Other | Source: Ambulatory Visit | Attending: Family Medicine | Admitting: Family Medicine

## 2013-02-03 DIAGNOSIS — Z853 Personal history of malignant neoplasm of breast: Secondary | ICD-10-CM

## 2013-02-03 DIAGNOSIS — Z9889 Other specified postprocedural states: Secondary | ICD-10-CM

## 2013-02-03 DIAGNOSIS — M8580 Other specified disorders of bone density and structure, unspecified site: Secondary | ICD-10-CM

## 2013-02-17 DIAGNOSIS — N815 Vaginal enterocele: Secondary | ICD-10-CM | POA: Diagnosis not present

## 2013-02-17 DIAGNOSIS — R351 Nocturia: Secondary | ICD-10-CM | POA: Diagnosis not present

## 2013-03-14 ENCOUNTER — Ambulatory Visit
Admission: RE | Admit: 2013-03-14 | Discharge: 2013-03-14 | Disposition: A | Discharge status: Home / Self Care | Payer: Medicare Other | Source: Ambulatory Visit | Attending: Family Medicine | Admitting: Family Medicine

## 2013-03-14 DIAGNOSIS — M899 Disorder of bone, unspecified: Secondary | ICD-10-CM | POA: Diagnosis not present

## 2013-03-24 DIAGNOSIS — M899 Disorder of bone, unspecified: Secondary | ICD-10-CM | POA: Diagnosis not present

## 2013-04-18 ENCOUNTER — Ambulatory Visit
Admission: RE | Admit: 2013-04-18 | Discharge: 2013-04-18 | Disposition: A | Discharge status: Home / Self Care | Payer: Medicare Other | Source: Ambulatory Visit | Attending: Family Medicine | Admitting: Family Medicine

## 2013-04-18 DIAGNOSIS — Z853 Personal history of malignant neoplasm of breast: Secondary | ICD-10-CM | POA: Diagnosis not present

## 2013-04-18 DIAGNOSIS — Z9889 Other specified postprocedural states: Secondary | ICD-10-CM

## 2013-07-11 DIAGNOSIS — R351 Nocturia: Secondary | ICD-10-CM | POA: Diagnosis not present

## 2013-07-28 DIAGNOSIS — N815 Vaginal enterocele: Secondary | ICD-10-CM | POA: Diagnosis not present

## 2013-07-28 DIAGNOSIS — N816 Rectocele: Secondary | ICD-10-CM | POA: Diagnosis not present

## 2013-08-19 DIAGNOSIS — H25019 Cortical age-related cataract, unspecified eye: Secondary | ICD-10-CM | POA: Diagnosis not present

## 2013-08-28 ENCOUNTER — Other Ambulatory Visit: Payer: Self-pay | Admitting: Urology

## 2013-09-04 ENCOUNTER — Encounter: Payer: Self-pay | Admitting: Podiatrist

## 2013-09-04 ENCOUNTER — Ambulatory Visit (INDEPENDENT_AMBULATORY_CARE_PROVIDER_SITE_OTHER): Payer: Medicare Other | Admitting: Podiatrist

## 2013-09-04 VITALS — Resp 16 | Ht 62.0 in | Wt 105.0 lb

## 2013-09-04 DIAGNOSIS — Q828 Other specified congenital malformations of skin: Secondary | ICD-10-CM

## 2013-09-04 DIAGNOSIS — M659 Synovitis and tenosynovitis, unspecified: Secondary | ICD-10-CM

## 2013-09-04 DIAGNOSIS — M216X9 Other acquired deformities of unspecified foot: Secondary | ICD-10-CM | POA: Diagnosis not present

## 2013-09-04 NOTE — Patient Instructions (Signed)
Shoe brands to look into-- [employer], finn comfort, naot (can do a customizable foot bed),   The neurologist is Dr. Gayla Medicus-- look her up  She has a great website-- if you would like a referral, please let me know

## 2013-09-09 NOTE — Progress Notes (Signed)
HPI:  Patient presents today for follow up of foot and callus care. States she still has a numbness type of feeling, swelling and general discomfort on her right foot despite extensive care in the past.  She had spoken to Dr. Doran Durand regarding surgery but he stated it was a 50 percent chance for improvement so she held off.  She has a painful callus on the bottom of this right foot.    Objective:  Patients chart is reviewed.  Vascular status reveals pedal pulses noted at 2 out of 4 dp and pt bilateral .  Neurological sensation is normal per Semmes Weinstein monofilament bilateral. Hyperkeratotic lesion is present submet 3/4 of the right foot.  It has a waxy plug and porokeratotic appearance.   Assessment:  Porokeratosis  Plan:  Discussed treatment options and alternatives.  Debridement of lesion carried out today.  salinocaine and aperture pad applied.  Recommended consult with Dr. Roxy Horseman-- she will consider and if she would like a referral she will call.

## 2013-10-08 ENCOUNTER — Encounter (HOSPITAL_COMMUNITY): Payer: Self-pay | Admitting: Pharmacy Technician

## 2013-10-15 ENCOUNTER — Encounter (HOSPITAL_COMMUNITY)
Admission: RE | Admit: 2013-10-15 | Discharge: 2013-10-15 | Disposition: A | Discharge status: Home / Self Care | Payer: Medicare Other | Source: Ambulatory Visit | Attending: Urology | Admitting: Urology

## 2013-10-15 ENCOUNTER — Encounter (HOSPITAL_COMMUNITY): Payer: Self-pay

## 2013-10-15 ENCOUNTER — Encounter (INDEPENDENT_AMBULATORY_CARE_PROVIDER_SITE_OTHER): Payer: Self-pay

## 2013-10-15 DIAGNOSIS — Z01812 Encounter for preprocedural laboratory examination: Secondary | ICD-10-CM | POA: Diagnosis not present

## 2013-10-15 DIAGNOSIS — Z01818 Encounter for other preprocedural examination: Secondary | ICD-10-CM | POA: Insufficient documentation

## 2013-10-15 HISTORY — DX: Other specified disorders of bone density and structure, unspecified site: M85.80

## 2013-10-15 HISTORY — DX: Unspecified osteoarthritis, unspecified site: M19.90

## 2013-10-15 HISTORY — DX: Diverticulosis of intestine, part unspecified, without perforation or abscess without bleeding: K57.90

## 2013-10-15 LAB — CBC
HCT: 41.5 % (ref 36.0–46.0)
HEMOGLOBIN: 13.7 g/dL (ref 12.0–15.0)
MCH: 30 pg (ref 26.0–34.0)
MCHC: 33 g/dL (ref 30.0–36.0)
MCV: 91 fL (ref 78.0–100.0)
Platelets: 219 10*3/uL (ref 150–400)
RBC: 4.56 MIL/uL (ref 3.87–5.11)
RDW: 13.5 % (ref 11.5–15.5)
WBC: 6.8 10*3/uL (ref 4.0–10.5)

## 2013-10-15 LAB — ABO/RH: ABO/RH(D): B NEG

## 2013-10-15 LAB — APTT: aPTT: 29 seconds (ref 24–37)

## 2013-10-15 LAB — PROTIME-INR
INR: 0.95 (ref 0.00–1.49)
Prothrombin Time: 12.5 seconds (ref 11.6–15.2)

## 2013-10-15 NOTE — Patient Instructions (Signed)
Your procedure is scheduled on:  10/21/13  TUESDAY  Report to [facility] at   [facility]    AM.   Call this number if you have problems the morning of surgery: [place] not eat food  Or drink :After Midnight. Monday NIGHT   Take these medicines the morning of surgery with A SIP OF WATER:NONE   .  Contacts, dentures or partial plates, or metal hairpins  can not be worn to surgery. Your family will be responsible for glasses, dentures, hearing aides while you are in surgery  Leave suitcase in the car. After surgery it may be brought to your room.  For patients admitted to the hospital, checkout time is 11:00 AM day of  discharge.         [facility] IS NOT RESPONSIBLE FOR ANY VALUABLES                                                                  [facility] - Preparing for Surgery Before surgery, you can play an important role.  Because skin is not sterile, your skin needs to be as free of germs as possible.  You can reduce the number of germs on your skin by washing with CHG (chlorahexidine gluconate) soap before surgery.  CHG is an antiseptic cleaner which kills germs and bonds with the skin to continue killing germs even after washing. Please DO NOT use if you have an allergy to CHG or antibacterial soaps.  If your skin becomes reddened/irritated stop using the CHG and inform your nurse when you arrive at Short Stay. Do not shave (including legs and underarms) for at least 48 hours prior to the first CHG shower.  You may shave your face/neck. Please follow these instructions carefully:  1.  Shower with CHG Soap the night before surgery and the  morning of Surgery.  2.  If you choose to wash your hair, wash your hair first as usual with your  normal  shampoo.  3.  After you shampoo, rinse your hair and body thoroughly to remove the  shampoo.                            4.  Use CHG as you would any other liquid soap.  You can apply chg directly  to the skin and wash                       Gently with a scrungie or clean washcloth.  5.  Apply the CHG Soap to your body ONLY FROM THE NECK DOWN.   Do not use on face/ open                           Wound or open sores. Avoid contact with eyes, ears mouth and genitals (private parts).                       Wash face,  Genitals (private parts) with your normal  soap.             6.  Wash thoroughly, paying special attention to the area where your surgery  will be performed.  7.  Thoroughly rinse your body with warm water from the neck down.  8.  DO NOT shower/wash with your normal soap after using and rinsing off  the CHG Soap.                9.  Pat yourself dry with a clean towel.            10.  Wear clean pajamas.            11.  Place clean sheets on your bed the night of your first shower and do not  sleep with pets. Day of Surgery : Do not apply any lotions/deodorants the morning of surgery.  Please wear clean clothes to the hospital/surgery center.  FAILURE TO FOLLOW THESE INSTRUCTIONS MAY RESULT IN THE CANCELLATION OF YOUR SURGERY PATIENT SIGNATURE_________________________________  NURSE SIGNATURE__________________________________  ________________________________________________________________________

## 2013-10-20 ENCOUNTER — Encounter (HOSPITAL_COMMUNITY): Payer: Self-pay | Admitting: Anesthesiology

## 2013-10-20 NOTE — H&P (Signed)
History of Present Illness   Samantha Pineda has a symptomatic cystocele with mild frequency and nocturia. On physical examination she had a grade 2 cystocele that almost looked like she has had previous surgery. She had a well-supported bladder neck and no stress incontinence. I felt she had an enterocele that reached the introitus. When she bore down she lost approximately 5 cm of length anteriorly and approximately 6 cm posteriorly. She also had a rectocele.   I felt that she primarily had an apical posterior defect but was symptomatic. I thought if she had surgery, she likely best benefit from a transvaginal vault suspension, repair of enterocele and rectocele and I did not think she needed a cystocele repair or sling. She may have at least had uterine prolapse years ago. She tends to be somewhat nervous. She was here to discuss her urodynamics.   Review of Systems: No other change in bowel or neurologic systems.   On urodynamics, she emptied efficiently. Maximum capacity is 330 mL. She had sensory urgency. Her bladder was stable. She did not leak with a Valsalva pressure of 91 cmH2O. During voluntary voiding, she voided 301 mL with a maximum flow of 15 mL/sec. Maximum voiding pressure was 18 cmH2O. Residual was 30 mL. EMG activity increased some during the voiding phase. Fluoroscopically, cystocele was not very prominent. She had a small bladder diverticulum as noted. She was quite nervous for the study. The details of the urodynamics are signed and dictated on the urodynamic sheet.   She has always been told she has a small bladder.    Past Medical History Problems  1. History of Arthritis (V13.4)  Surgical History Problems  1. History of Breast Surgery Lumpectomy 2. History of Hysterectomy  Current Meds 1. Aleve TABS;  Therapy: (Recorded:13Oct2014) to Recorded 2. Calcium + D TABS;  Therapy: (Recorded:13Oct2014) to Recorded 3. Multi-Vitamin TABS;  Therapy: (Recorded:13Oct2014) to  Recorded 4. Vitamin C TABS;  Therapy: (Recorded:13Oct2014) to Recorded 5. Vitamin D TABS;  Therapy: (Recorded:13Oct2014) to Recorded  Allergies Medication  1. Codeine Derivatives  Family History Problems  1. Family history of Cardiac Arrest : Father 2. Family history of Chronic Obstructive Pulmonary Disease : Mother 3. Family history of Death In The Family Father : Father   father passed @ age 59cardiac arrest 108. Family history of Death In The Family Mother : Mother   mother passed @ age 75COPD 5. Family history of Family Health Status Number Of Children   1 son  Social History Problems  1. Denied: History of Alcohol Use 2. Caffeine Use   3 cups of coffee 3. Marital History - Widowed 4. Occupation:   financials 5. Denied: History of Tobacco Use  Assessment Assessed  1. 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 Follow-up Schedule Surgery Office  Follow-up  Status: Complete  Done: [id number]  Discussion/Summary   I drew Samantha Pineda a picture. I went over a transvaginal vault suspension with cystocele____ repair, rectocele and graft, and possible enterocele repair.   I talked about a pessary and watchful waiting and natural history.  I drew her a picture and we talked about prolapse surgery in detail. Pros, cons, general surgical and anesthetic risks, and other options including behavioral therapy, pessaries, and watchful waiting were discussed. She understands that prolapse repairs are successful in 80-85% of cases for prolapse symptoms and can recur anteriorly, posteriorly, and/or apically. She understands that in most cases I use a graft and general risks were  discussed. Surgical risks were described but not limited to the discussion of injury to neighboring structures including the bowel (with possible life-threatening sepsis and colostomy), bladder, urethra, vagina (all resulting in further surgery), and ureter (resulting  in re-implantation). We talked about injury to nerves/soft tissue leading to debilitating and intractable pelvic, abdominal, and lower extremity pain syndromes and neuropathies. The risks of buttock pain, intractable dyspareunia, and vaginal narrowing and shortening with sequelae were discussed. Bleeding risks, transfusion rates, and infection were discussed. The risk of persistent, de novo, or worsening bladder and/or bowel incontinence/dysfunction was discussed. The need for CIC was described as well the usual post-operative course. The patient understands that she might not reach her treatment goal and that she might be worse following surgery.  Mesh issues discussed. De novo incontinence risks discussed.  I believe Samantha Pineda would like to proceed with surgery. She tends to be a very nervous lady, but I think all of her questions were answered today. She is not sexually active.   After a thorough review of the management options for the patient's condition the patient  elected to proceed with surgical therapy as noted above. We have discussed the potential benefits and risks of the procedure, side effects of the proposed treatment, the likelihood of the patient achieving the goals of the procedure, and any potential problems that might occur during the procedure or recuperation. Informed consent has been obtained.

## 2013-10-20 NOTE — Anesthesia Preprocedure Evaluation (Signed)

## 2013-10-21 ENCOUNTER — Encounter (HOSPITAL_COMMUNITY)
Admission: RE | Disposition: A | Discharge status: Home / Self Care | Payer: Self-pay | Source: Ambulatory Visit | Attending: Urology

## 2013-10-21 ENCOUNTER — Observation Stay (HOSPITAL_COMMUNITY)
Admission: RE | Admit: 2013-10-21 | Discharge: 2013-10-22 | Disposition: A | Discharge status: Home / Self Care | Payer: Medicare Other | Source: Ambulatory Visit | Attending: Urology | Admitting: Urology

## 2013-10-21 ENCOUNTER — Encounter (HOSPITAL_COMMUNITY): Payer: Medicare Other | Admitting: Anesthesiology

## 2013-10-21 ENCOUNTER — Encounter (HOSPITAL_COMMUNITY): Payer: Self-pay | Admitting: Anesthesiology

## 2013-10-21 ENCOUNTER — Ambulatory Visit (HOSPITAL_COMMUNITY): Payer: Medicare Other | Admitting: Anesthesiology

## 2013-10-21 DIAGNOSIS — N993 Prolapse of vaginal vault after hysterectomy: Principal | ICD-10-CM | POA: Insufficient documentation

## 2013-10-21 DIAGNOSIS — Z79899 Other long term (current) drug therapy: Secondary | ICD-10-CM | POA: Diagnosis not present

## 2013-10-21 DIAGNOSIS — Z90710 Acquired absence of both cervix and uterus: Secondary | ICD-10-CM | POA: Insufficient documentation

## 2013-10-21 DIAGNOSIS — N8110 Cystocele, unspecified: Secondary | ICD-10-CM | POA: Diagnosis not present

## 2013-10-21 DIAGNOSIS — N814 Uterovaginal prolapse, unspecified: Secondary | ICD-10-CM | POA: Diagnosis not present

## 2013-10-21 DIAGNOSIS — N816 Rectocele: Secondary | ICD-10-CM | POA: Diagnosis not present

## 2013-10-21 DIAGNOSIS — N323 Diverticulum of bladder: Secondary | ICD-10-CM | POA: Diagnosis not present

## 2013-10-21 DIAGNOSIS — N815 Vaginal enterocele: Secondary | ICD-10-CM | POA: Diagnosis not present

## 2013-10-21 DIAGNOSIS — K469 Unspecified abdominal hernia without obstruction or gangrene: Secondary | ICD-10-CM | POA: Diagnosis not present

## 2013-10-21 HISTORY — PX: CYSTOSCOPY: SHX5120

## 2013-10-21 HISTORY — PX: ANTERIOR AND POSTERIOR REPAIR: SHX5121

## 2013-10-21 LAB — HEMOGLOBIN AND HEMATOCRIT, BLOOD
HEMATOCRIT: 36.2 % (ref 36.0–46.0)
HEMOGLOBIN: 11.8 g/dL — AB (ref 12.0–15.0)

## 2013-10-21 LAB — TYPE AND SCREEN
ABO/RH(D): B NEG
Antibody Screen: NEGATIVE

## 2013-10-21 SURGERY — CYSTOSCOPY
Anesthesia: General

## 2013-10-21 MED ORDER — ACETAMINOPHEN 325 MG PO TABS
650.0000 mg | ORAL_TABLET | ORAL | Status: DC | PRN
  Administered 2013-10-22: 650 mg via ORAL
  Filled 2013-10-21: qty 2

## 2013-10-21 MED ORDER — GLYCOPYRROLATE 0.2 MG/ML IJ SOLN
INTRAMUSCULAR | Status: DC | PRN
  Administered 2013-10-21: .3 mg via INTRAVENOUS

## 2013-10-21 MED ORDER — ONDANSETRON HCL 4 MG/2ML IJ SOLN
4.0000 mg | INTRAMUSCULAR | Status: DC | PRN
  Administered 2013-10-21 (×2): 4 mg via INTRAVENOUS
  Filled 2013-10-21 (×2): qty 2

## 2013-10-21 MED ORDER — ESTRADIOL 0.1 MG/GM VA CREA
TOPICAL_CREAM | VAGINAL | Status: AC
  Filled 2013-10-21: qty 85

## 2013-10-21 MED ORDER — FENTANYL CITRATE 0.05 MG/ML IJ SOLN
INTRAMUSCULAR | Status: AC
  Filled 2013-10-21: qty 2

## 2013-10-21 MED ORDER — MORPHINE SULFATE 2 MG/ML IJ SOLN
2.0000 mg | INTRAMUSCULAR | Status: DC | PRN
  Administered 2013-10-21: 2 mg via INTRAVENOUS
  Filled 2013-10-21: qty 1

## 2013-10-21 MED ORDER — PROPOFOL 10 MG/ML IV BOLUS
INTRAVENOUS | Status: AC
  Filled 2013-10-21: qty 20

## 2013-10-21 MED ORDER — PHENAZOPYRIDINE HCL 200 MG PO TABS
200.0000 mg | ORAL_TABLET | Freq: Once | ORAL | Status: AC
  Administered 2013-10-21: 200 mg via ORAL
  Filled 2013-10-21: qty 1

## 2013-10-21 MED ORDER — DEXAMETHASONE SODIUM PHOSPHATE 10 MG/ML IJ SOLN
INTRAMUSCULAR | Status: AC
  Filled 2013-10-21: qty 1

## 2013-10-21 MED ORDER — PROMETHAZINE HCL 25 MG/ML IJ SOLN
6.2500 mg | INTRAMUSCULAR | Status: DC | PRN
  Administered 2013-10-21: 6.25 mg via INTRAVENOUS

## 2013-10-21 MED ORDER — DEXTROSE-NACL 5-0.45 % IV SOLN
INTRAVENOUS | Status: DC
  Administered 2013-10-21 – 2013-10-22 (×3): via INTRAVENOUS

## 2013-10-21 MED ORDER — DEXAMETHASONE SODIUM PHOSPHATE 10 MG/ML IJ SOLN
INTRAMUSCULAR | Status: DC | PRN
  Administered 2013-10-21: 10 mg via INTRAVENOUS

## 2013-10-21 MED ORDER — 0.9 % SODIUM CHLORIDE (POUR BTL) OPTIME
TOPICAL | Status: DC | PRN
  Administered 2013-10-21: 1000 mL

## 2013-10-21 MED ORDER — PROPOFOL 10 MG/ML IV BOLUS
INTRAVENOUS | Status: DC | PRN
  Administered 2013-10-21: 110 mg via INTRAVENOUS

## 2013-10-21 MED ORDER — GENTAMICIN SULFATE 40 MG/ML IJ SOLN
240.0000 mg | Freq: Once | INTRAVENOUS | Status: AC
  Administered 2013-10-21: 240 mg via INTRAVENOUS
  Filled 2013-10-21: qty 6

## 2013-10-21 MED ORDER — CIPROFLOXACIN HCL 250 MG PO TABS: 250.0000 mg | ORAL_TABLET | Freq: Two times a day (BID) | ORAL | Status: AC

## 2013-10-21 MED ORDER — EPHEDRINE SULFATE 50 MG/ML IJ SOLN
INTRAMUSCULAR | Status: DC | PRN
  Administered 2013-10-21 (×3): 5 mg via INTRAVENOUS

## 2013-10-21 MED ORDER — CISATRACURIUM BESYLATE 20 MG/10ML IV SOLN
INTRAVENOUS | Status: AC
  Filled 2013-10-21: qty 10

## 2013-10-21 MED ORDER — SODIUM CHLORIDE 0.9 % IJ SOLN
INTRAMUSCULAR | Status: AC
  Filled 2013-10-21: qty 10

## 2013-10-21 MED ORDER — METHYLENE BLUE 1 % INJ SOLN
INTRAMUSCULAR | Status: AC
Start: 2013-10-21 — End: 2013-10-21
  Filled 2013-10-21: qty 10

## 2013-10-21 MED ORDER — LIDOCAINE-EPINEPHRINE (PF) 1 %-1:200000 IJ SOLN
INTRAMUSCULAR | Status: DC | PRN
  Administered 2013-10-21: 20 mL

## 2013-10-21 MED ORDER — FENTANYL CITRATE 0.05 MG/ML IJ SOLN
25.0000 ug | INTRAMUSCULAR | Status: DC | PRN
  Administered 2013-10-21: 50 ug via INTRAVENOUS

## 2013-10-21 MED ORDER — LIDOCAINE HCL (CARDIAC) 20 MG/ML IV SOLN
INTRAVENOUS | Status: AC
  Filled 2013-10-21: qty 5

## 2013-10-21 MED ORDER — ONDANSETRON HCL 4 MG/2ML IJ SOLN
INTRAMUSCULAR | Status: DC | PRN
  Administered 2013-10-21: 4 mg via INTRAVENOUS

## 2013-10-21 MED ORDER — HYDROCODONE-ACETAMINOPHEN 5-325 MG PO TABS: 1.0000 | ORAL_TABLET | Freq: Four times a day (QID) | ORAL | Status: DC | PRN

## 2013-10-21 MED ORDER — SODIUM CHLORIDE 0.9 % IR SOLN
Status: DC | PRN
  Administered 2013-10-21: 08:00:00

## 2013-10-21 MED ORDER — CISATRACURIUM BESYLATE (PF) 10 MG/5ML IV SOLN
INTRAVENOUS | Status: DC | PRN
  Administered 2013-10-21: 2 mg via INTRAVENOUS
  Administered 2013-10-21: 10 mg via INTRAVENOUS

## 2013-10-21 MED ORDER — MIDAZOLAM HCL 2 MG/2ML IJ SOLN
INTRAMUSCULAR | Status: AC
  Filled 2013-10-21: qty 2

## 2013-10-21 MED ORDER — MIDAZOLAM HCL 5 MG/5ML IJ SOLN
INTRAMUSCULAR | Status: DC | PRN
  Administered 2013-10-21 (×2): 0.5 mg via INTRAVENOUS
  Administered 2013-10-21: 1 mg via INTRAVENOUS

## 2013-10-21 MED ORDER — EPHEDRINE SULFATE 50 MG/ML IJ SOLN
INTRAMUSCULAR | Status: AC
  Filled 2013-10-21: qty 1

## 2013-10-21 MED ORDER — SUFENTANIL CITRATE 50 MCG/ML IV SOLN
INTRAVENOUS | Status: DC | PRN
  Administered 2013-10-21 (×4): 5 ug via INTRAVENOUS
  Administered 2013-10-21: 15 ug via INTRAVENOUS
  Administered 2013-10-21: 5 ug via INTRAVENOUS

## 2013-10-21 MED ORDER — LIDOCAINE HCL (CARDIAC) 20 MG/ML IV SOLN
INTRAVENOUS | Status: DC | PRN
  Administered 2013-10-21: 75 mg via INTRAVENOUS

## 2013-10-21 MED ORDER — NEOSTIGMINE METHYLSULFATE 10 MG/10ML IV SOLN
INTRAVENOUS | Status: DC | PRN
  Administered 2013-10-21: 2.5 mg via INTRAVENOUS

## 2013-10-21 MED ORDER — SODIUM CHLORIDE 0.9 % IV SOLN
1.5000 g | INTRAVENOUS | Status: AC
  Administered 2013-10-21: 1.5 g via INTRAVENOUS
  Filled 2013-10-21: qty 1.5

## 2013-10-21 MED ORDER — ESTRADIOL 0.1 MG/GM VA CREA
TOPICAL_CREAM | VAGINAL | Status: DC | PRN
  Administered 2013-10-21: 1 via VAGINAL

## 2013-10-21 MED ORDER — LACTATED RINGERS IV SOLN
INTRAVENOUS | Status: DC | PRN
  Administered 2013-10-21 (×2): via INTRAVENOUS

## 2013-10-21 MED ORDER — PROMETHAZINE HCL 25 MG/ML IJ SOLN
INTRAMUSCULAR | Status: AC
  Filled 2013-10-21: qty 1

## 2013-10-21 MED ORDER — ONDANSETRON HCL 4 MG/2ML IJ SOLN
INTRAMUSCULAR | Status: AC
  Filled 2013-10-21: qty 2

## 2013-10-21 MED ORDER — LACTATED RINGERS IV SOLN: INTRAVENOUS | Status: DC

## 2013-10-21 MED ORDER — HYDROCODONE-ACETAMINOPHEN 5-325 MG PO TABS: 1.0000 | ORAL_TABLET | ORAL | Status: DC | PRN

## 2013-10-21 MED ORDER — FLEET ENEMA 7-19 GM/118ML RE ENEM: 1.0000 | ENEMA | Freq: Once | RECTAL | Status: DC

## 2013-10-21 MED ORDER — SUFENTANIL CITRATE 50 MCG/ML IV SOLN
INTRAVENOUS | Status: AC
  Filled 2013-10-21: qty 1

## 2013-10-21 SURGICAL SUPPLY — 51 items
ALLOGRAFT TUTOPLAST AXIS 6X12 (Tissue) ×1 IMPLANT
BAG URINE DRAINAGE (UROLOGICAL SUPPLIES) ×2 IMPLANT
BLADE HEX COATED 2.75 (ELECTRODE) ×2 IMPLANT
BLADE SURG 15 STRL LF DISP TIS (BLADE) ×2 IMPLANT
BLADE SURG 15 STRL SS (BLADE) ×2
CATH FOLEY 2WAY SLVR  5CC 16FR (CATHETERS) ×1
CATH FOLEY 2WAY SLVR 5CC 16FR (CATHETERS) ×1 IMPLANT
COVER MAYO STAND STRL (DRAPES) IMPLANT
COVER SURGICAL LIGHT HANDLE (MISCELLANEOUS) ×2 IMPLANT
DEVICE CAPIO SUTURING (INSTRUMENTS) ×1
DEVICE CAPIO SUTURING OPC (INSTRUMENTS) ×1 IMPLANT
DRAIN PENROSE 18X1/4 LTX STRL (WOUND CARE) ×2 IMPLANT
DRAPE CAMERA CLOSED 9X96 (DRAPES) ×2 IMPLANT
GAUZE PACKING 2X5 YD STRL (GAUZE/BANDAGES/DRESSINGS) ×2 IMPLANT
GAUZE SPONGE 4X4 16PLY XRAY LF (GAUZE/BANDAGES/DRESSINGS) ×6 IMPLANT
GLOVE BIOGEL M STRL SZ7.5 (GLOVE) ×2 IMPLANT
GLOVE ECLIPSE 8.5 STRL (GLOVE) ×8 IMPLANT
GOWN STRL REUS W/TWL XL LVL3 (GOWN DISPOSABLE) ×2 IMPLANT
HOLDER FOLEY CATH W/STRAP (MISCELLANEOUS) ×2 IMPLANT
IV NS 1000ML (IV SOLUTION)
IV NS 1000ML BAXH (IV SOLUTION) IMPLANT
KIT BASIN OR (CUSTOM PROCEDURE TRAY) ×2 IMPLANT
NEEDLE HYPO 22GX1.5 SAFETY (NEEDLE) IMPLANT
NEEDLE MAYO 6 CRC TAPER PT (NEEDLE) ×2 IMPLANT
NS IRRIG 1000ML POUR BTL (IV SOLUTION) IMPLANT
PACK CYSTO (CUSTOM PROCEDURE TRAY) ×2 IMPLANT
PENCIL BUTTON HOLSTER BLD 10FT (ELECTRODE) ×2 IMPLANT
PLUG CATH AND CAP STER (CATHETERS) ×2 IMPLANT
RETRACTOR STAY HOOK 5MM (MISCELLANEOUS) ×2 IMPLANT
SHEET LAVH (DRAPES) ×2 IMPLANT
SUT CAPIO ETHIBPND (SUTURE) ×4 IMPLANT
SUT VIC AB 0 CT1 27 (SUTURE) ×1
SUT VIC AB 0 CT1 27XBRD ANTBC (SUTURE) ×1 IMPLANT
SUT VIC AB 2-0 CT1 27 (SUTURE) ×2
SUT VIC AB 2-0 CT1 27XBRD (SUTURE) ×1 IMPLANT
SUT VIC AB 2-0 CT1 TAPERPNT 27 (SUTURE) ×1 IMPLANT
SUT VIC AB 2-0 SH 27 (SUTURE) ×4
SUT VIC AB 2-0 SH 27X BRD (SUTURE) ×4 IMPLANT
SUT VIC AB 3-0 PS2 18 (SUTURE)
SUT VIC AB 3-0 PS2 18XBRD (SUTURE) IMPLANT
SUT VIC AB 3-0 SH 27 (SUTURE) ×2
SUT VIC AB 3-0 SH 27XBRD (SUTURE) ×2 IMPLANT
SUT VICRYL 0 UR6 27IN ABS (SUTURE) ×4 IMPLANT
SYR BULB IRRIGATION 50ML (SYRINGE) ×2 IMPLANT
SYRINGE 10CC LL (SYRINGE) ×2 IMPLANT
TOWEL OR 17X26 10 PK STRL BLUE (TOWEL DISPOSABLE) ×2 IMPLANT
TOWEL OR NON WOVEN STRL DISP B (DISPOSABLE) ×2 IMPLANT
TUBING CONNECTING 10 (TUBING) ×4 IMPLANT
TUTOPLAST AXIS 6X12 (Tissue) ×2 IMPLANT
WATER STERILE IRR 1500ML POUR (IV SOLUTION) IMPLANT
YANKAUER SUCT BULB TIP 10FT TU (MISCELLANEOUS) ×2 IMPLANT

## 2013-10-21 NOTE — Op Note (Signed)
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 Nicki Reaper Xochilth Standish Assistant: Leta Baptist  The patient has the above diagnoses and consented to the above procedure. Extra care was taken with leg positioning to minimize the risk of compartment syndrome and neuropathy and deep vein thrombosis. Preoperative antibiotics were given. Preoperative laboratory tests were normal.  Under anesthesia the patient's anterior vaginal wall was very well supported when I supported her apex. She had a short anterior vaginal wall. She had a very long posterior vaginal wall in keeping with an enterocele and redundant vaginal mucosa. I could pull the apex almost to the introitus. With the vault supported she did not have a large posterior defect and only a milder or modest rectocele  20 cc of a lidocaine epinephrine mixture was applied posteriorly. Between 2 Allis clamps on the posterior fourchette I removed a small posterior triangle of perineal skin. I did a long posterior vaginal wall incision with my Allis technique. I sharply and bluntly mobilized the thin overlying vaginal mucosa from the underlying rectovaginal fascia. I mobilized very well at the apex and later on gently broke through the rectal pillars bilaterally at the high apex  A rectal examination and direct vision I identified an enterocele which was partially mobilized before I opened it. I then did open it. She had a long enterocele sac with no bowel within. With appropriate exposure using mosquito clamps I imbricated the neck of the enterocele not picking up surrounding soft tissue with a pursestring 3-0 Vicryl suture. The sac was mobilized very well from surrounding soft tissue including the rectum prior to this maneuver  The enterocele sac was sent for  pathology after its excision  I could not do my trapdoor repair of rectovaginal mucosa to the apex since it would greatly shorten her vagina. I could see an obvious large apical defect and one smaller midline defect more caudally.  Instead I placed a 3-0 Vicryl suture just to the right and left of the midline thru the rectovaginal fascia and tagged them. This was later brought out through the cephalad aspect of the graft  With appropriate finger dissection I felt the ischial spine bilaterally. The right spine was not quite as prominent. I swept all soft tissue medially. With a Capio device I placed a 0 Ethibond suture in the sacrospinous ligament 1 full finger breath medial to the spine in a straight line between the spines. I did a digital rectal examination and there was no injury to rectum and there was no suture in the rectum. I triple checked the position of the sutures  A 12 x 6 fascia lata graft was appropriately prepared. I cut it to 10 x 6 and then in the shape of a trapezoid.  With my usual technique I brought the sacrospinous ligament sutures through the graft but also the rectovaginal sutures in the midline. I sewed the graft in nicely tension-free and in tension-free manner the rectovaginal fascia to the cephalad aspect of the graft to cover the apical defect. I then used 0 Vicryl on a UR 6 needle to gently reapproximate the graft distally tension-free.  An appropriate amount of posterior vaginal wall mucosa was excised. I closed the vaginal wall mucosa with running 2-0 Vicryl on a CT1 needle. 1 gentle 0 Vicryl suture was used for the perineal body. I exteriorized the suture and  closed the perineum with subcuticular 2-0 Vicryl.  The patient had excellent vaginal length at the end of the case. She had very good support anteriorly and posteriorly.  I cystoscoped the patient and she excellent yellow jets bilaterally.  Blood loss was less than 50 mL. Leg position was good. Urine was clear.  Patient was taken to the recover room. Hopefully the operation will reach the patient's treatment goal

## 2013-10-21 NOTE — Anesthesia Procedure Notes (Signed)
Procedure Name: Intubation Date/Time: 10/21/2013 7:39 AM Performed by: Danley Danker L Patient Re-evaluated:Patient Re-evaluated prior to inductionOxygen Delivery Method: Circle system utilized Preoxygenation: Pre-oxygenation with 100% oxygen Intubation Type: IV induction Ventilation: Mask ventilation without difficulty and Oral airway inserted - appropriate to patient size Laryngoscope Size: Miller and 2 Grade View: Grade I Tube type: Oral Tube size: 7.0 mm Number of attempts: 1 Airway Equipment and Method: Stylet Secured at: 21 cm Tube secured with: Tape Dental Injury: Teeth and Oropharynx as per pre-operative assessment

## 2013-10-21 NOTE — Transfer of Care (Signed)
Immediate Anesthesia Transfer of Care Note  Patient: Samantha Pineda  Procedure(s) Performed: Procedure(s): CYSTOSCOPY (N/A) REPAIR RECTOCELE, ENTEROCELE AND VAULT PROLAPSE AND GRAFT (N/A)  Patient Location: PACU  Anesthesia Type:General  Level of Consciousness: awake, alert  and oriented  Airway & Oxygen Therapy: Patient Spontanous Breathing and Patient connected to face mask oxygen  Post-op Assessment: Report given to PACU RN and Post -op Vital signs reviewed and stable  Post vital signs: Reviewed and stable  Complications: No apparent anesthesia complications

## 2013-10-21 NOTE — Interval H&P Note (Signed)
History and Physical Interval Note:  10/21/2013 7:16 AM  Samantha Pineda  has presented today for surgery, with the diagnosis of rectocele  vault prolapse  enterocele  The various methods of treatment have been discussed with the patient and family. After consideration of risks, benefits and other options for treatment, the patient has consented to  Procedure(s): CYSTOSCOPY (N/A) REPAIR RECTOCELE, ENTEROCELE AND VAULT PROLAPSE AND GRAFT (N/A) as a surgical intervention .  The patient's history has been reviewed, patient examined, no change in status, stable for surgery.  I have reviewed the patient's chart and labs.  Questions were answered to the patient's satisfaction.     MACDIARMID,SCOTT A

## 2013-10-21 NOTE — Progress Notes (Signed)
Samantha Baptist, PA for Dr. Matilde Sprang  In to see patient- made aware of patient's bloody drainage on vaginal packing- peripad-  And bed.

## 2013-10-21 NOTE — Care Management Note (Addendum)
    Page 1 of 1   10/22/2013     10:35:15 AM CARE MANAGEMENT NOTE 10/22/2013  Patient:  Samantha Pineda, Samantha Pineda   Account Number:  [id number]  Date Initiated:  10/21/2013  Documentation initiated by:  Dessa Phi  Subjective/Objective Assessment:   [place], FREQUENCY,NOCTURIA.     Action/Plan:   FROM HOME.   Anticipated DC Date:  10/22/2013   Anticipated DC Plan:  [place]  CM consult      Choice offered to / List presented to:             Status of service:  Completed, signed off Medicare Important Message given?   (If response is "NO", the following Medicare IM given date fields will be blank) Date Medicare IM given:   Date Additional Medicare IM given:    Discharge Disposition:  HOME/SELF CARE  Per UR Regulation:  Reviewed for med. necessity/level of care/duration of stay  If discussed at [place] of Stay Meetings, dates discussed:    Comments:  10/21/13 KATHY MAHABIR RN,BSN NCM [phone] S/P RECTOCELE.NO ANTICIPATED D/C NEEDS.

## 2013-10-21 NOTE — Anesthesia Postprocedure Evaluation (Signed)
  Anesthesia Post-op Note  Patient: Samantha Pineda  Procedure(s) Performed: Procedure(s) (LRB): CYSTOSCOPY (N/A) REPAIR RECTOCELE, ENTEROCELE AND VAULT PROLAPSE AND GRAFT (N/A)  Patient Location: PACU  Anesthesia Type: General  Level of Consciousness: awake and alert   Airway and Oxygen Therapy: Patient Spontanous Breathing  Post-op Pain: mild  Post-op Assessment: Post-op Vital signs reviewed, Patient's Cardiovascular Status Stable, Respiratory Function Stable, Patent Airway and No signs of Nausea or vomiting  Last Vitals:  Filed Vitals:   10/21/13 1137  BP: 116/58  Pulse: 66  Temp: 36.6 C  Resp: 14    Post-op Vital Signs: stable   Complications: No apparent anesthesia complications

## 2013-10-22 ENCOUNTER — Encounter (HOSPITAL_COMMUNITY): Payer: Self-pay | Admitting: Urology

## 2013-10-22 LAB — HEMOGLOBIN AND HEMATOCRIT, BLOOD
HCT: 33.9 % — ABNORMAL LOW (ref 36.0–46.0)
HEMOGLOBIN: 11.1 g/dL — AB (ref 12.0–15.0)

## 2013-10-22 LAB — BASIC METABOLIC PANEL
BUN: 7 mg/dL (ref 6–23)
CHLORIDE: 107 meq/L (ref 96–112)
CO2: 25 mEq/L (ref 19–32)
Calcium: 8.2 mg/dL — ABNORMAL LOW (ref 8.4–10.5)
Creatinine, Ser: 0.52 mg/dL (ref 0.50–1.10)
Glucose, Bld: 113 mg/dL — ABNORMAL HIGH (ref 70–99)
POTASSIUM: 3.5 meq/L — AB (ref 3.7–5.3)
SODIUM: 141 meq/L (ref 137–147)

## 2013-10-22 NOTE — Progress Notes (Signed)
Pt voided 100cc yellow urine and bladder scan of 425. Per previous MD orders Foley catheter inserted prior to DC to home Foley inserted without difficulty and immediate return of 400cc yellow urine. Pt and Sister given home care teaching and instructions  regarding Foley Care and leg bag care. Pt able to return demonstration of foley to leg and ability to empty both bags. Dr. Matilde Sprang also given update via phone

## 2013-10-22 NOTE — Progress Notes (Signed)
Voided 150 blood tinged urine and bladder scan 360

## 2013-10-22 NOTE — Progress Notes (Signed)
Foley catheter and vaginal packing D/C'd this am per MD order. Will continue to monitor pt and check post void residuals. Samantha Pineda

## 2013-10-22 NOTE — Progress Notes (Signed)
Dr. Matilde Sprang given update via phoen regarding voiding mounts and Bladder Scanner results new orders given. Will inform Pt regarding new information from MD.

## 2013-10-22 NOTE — Progress Notes (Signed)
UR Completed Brenda Graves-Bigelow, RN,BSN [phone]  

## 2013-10-22 NOTE — Discharge Instructions (Signed)
As discussed with Dr. Kaylyn Layer may resume aspirin, vitamins, and supplements 7 days after surgery.  I have reviewed discharge instructions in detail with the patient. They will follow-up with me or their physician as scheduled. My nurse will also be calling the patients as per protocol.

## 2013-10-22 NOTE — Discharge Summary (Signed)
Date of admission: 10/21/2013  Date of discharge: 10/22/2013  Admission diagnosis: vault prolapse, rectocele  Discharge diagnosis: Vault prolapse, rectocele  Secondary diagnoses: enterocele  History and Physical: For full details, please see admission history and physical. Briefly, Samantha Pineda is a 68 y.o. year old patient with the above diagnosis.   Hospital Course: Surgery went very well with normal post op course  Laboratory values:  Recent Labs  10/21/13 1620 10/22/13 0333  HGB 11.8* 11.1*  HCT 36.2 33.9*    Recent Labs  10/22/13 0333  CREATININE 0.52    Disposition: Home  Discharge instruction: The patient was instructed to be ambulatory but told to refrain from heavy lifting, strenuous activity, or driving. Described in detail  Discharge medications:    Medication List    STOP taking these medications       CALCIUM 600/VITAMIN D 600-400 MG-UNIT per tablet  Generic drug:  Calcium Carbonate-Vitamin D     DAILY VITAMIN PO     Vitamin D 2000 UNITS Caps      TAKE these medications       acetaminophen 500 MG tablet  Commonly known as:  TYLENOL  Take 500 mg by mouth every 6 (six) hours as needed for moderate pain.     ciprofloxacin 250 MG tablet  Commonly known as:  CIPRO  Take 1 tablet (250 mg total) by mouth 2 (two) times daily.     ibandronate 150 MG tablet  Commonly known as:  BONIVA  Take 150 mg by mouth every 30 (thirty) days.        Followup:      Follow-up Information   Follow up with MACDIARMID,SCOTT A, MD. (office will call you with date and time of appt. )    Specialty:  Urology   Contact information:   [place] [address] [phone]       Follow up with MACDIARMID,SCOTT A, MD. (as scheduled)    Specialty:  Urology   Contact information:   [place] [address] [phone]

## 2013-10-22 NOTE — Progress Notes (Signed)
Looks good Tail bone pain settled with walking Vitals normal Labs normal Instructions given

## 2013-10-24 DIAGNOSIS — N816 Rectocele: Secondary | ICD-10-CM | POA: Diagnosis not present

## 2013-10-24 DIAGNOSIS — R82998 Other abnormal findings in urine: Secondary | ICD-10-CM | POA: Diagnosis not present

## 2013-10-24 DIAGNOSIS — N815 Vaginal enterocele: Secondary | ICD-10-CM | POA: Diagnosis not present

## 2013-11-17 DIAGNOSIS — R300 Dysuria: Secondary | ICD-10-CM | POA: Diagnosis not present

## 2013-11-24 DIAGNOSIS — N816 Rectocele: Secondary | ICD-10-CM | POA: Diagnosis not present

## 2013-12-16 DIAGNOSIS — L259 Unspecified contact dermatitis, unspecified cause: Secondary | ICD-10-CM | POA: Diagnosis not present

## 2013-12-19 DIAGNOSIS — L255 Unspecified contact dermatitis due to plants, except food: Secondary | ICD-10-CM | POA: Diagnosis not present

## 2013-12-30 DIAGNOSIS — H9319 Tinnitus, unspecified ear: Secondary | ICD-10-CM | POA: Diagnosis not present

## 2014-01-13 DIAGNOSIS — Z0000 Encounter for general adult medical examination without abnormal findings: Secondary | ICD-10-CM | POA: Diagnosis not present

## 2014-01-13 DIAGNOSIS — M899 Disorder of bone, unspecified: Secondary | ICD-10-CM | POA: Diagnosis not present

## 2014-01-13 DIAGNOSIS — D0590 Unspecified type of carcinoma in situ of unspecified breast: Secondary | ICD-10-CM | POA: Diagnosis not present

## 2014-01-13 DIAGNOSIS — M949 Disorder of cartilage, unspecified: Secondary | ICD-10-CM | POA: Diagnosis not present

## 2014-01-13 DIAGNOSIS — Z131 Encounter for screening for diabetes mellitus: Secondary | ICD-10-CM | POA: Diagnosis not present

## 2014-01-13 DIAGNOSIS — N8111 Cystocele, midline: Secondary | ICD-10-CM | POA: Diagnosis not present

## 2014-01-19 DIAGNOSIS — Z0000 Encounter for general adult medical examination without abnormal findings: Secondary | ICD-10-CM | POA: Diagnosis not present

## 2014-01-19 DIAGNOSIS — R059 Cough, unspecified: Secondary | ICD-10-CM | POA: Diagnosis not present

## 2014-01-19 DIAGNOSIS — M899 Disorder of bone, unspecified: Secondary | ICD-10-CM | POA: Diagnosis not present

## 2014-01-19 DIAGNOSIS — R05 Cough: Secondary | ICD-10-CM | POA: Diagnosis not present

## 2014-01-19 DIAGNOSIS — Z23 Encounter for immunization: Secondary | ICD-10-CM | POA: Diagnosis not present

## 2014-01-19 DIAGNOSIS — M949 Disorder of cartilage, unspecified: Secondary | ICD-10-CM | POA: Diagnosis not present

## 2014-01-19 DIAGNOSIS — Z1211 Encounter for screening for malignant neoplasm of colon: Secondary | ICD-10-CM | POA: Diagnosis not present

## 2014-02-12 ENCOUNTER — Encounter: Payer: Self-pay | Admitting: Internal Medicine

## 2014-03-05 DIAGNOSIS — Z23 Encounter for immunization: Secondary | ICD-10-CM | POA: Diagnosis not present

## 2014-03-05 DIAGNOSIS — R300 Dysuria: Secondary | ICD-10-CM | POA: Diagnosis not present

## 2014-03-12 ENCOUNTER — Other Ambulatory Visit: Payer: Self-pay | Admitting: Family Medicine

## 2014-03-12 DIAGNOSIS — Z1231 Encounter for screening mammogram for malignant neoplasm of breast: Secondary | ICD-10-CM

## 2014-04-20 ENCOUNTER — Other Ambulatory Visit: Payer: Self-pay | Admitting: Family Medicine

## 2014-04-20 ENCOUNTER — Ambulatory Visit
Admission: RE | Admit: 2014-04-20 | Discharge: 2014-04-20 | Disposition: A | Discharge status: Home / Self Care | Payer: Medicare Other | Source: Ambulatory Visit | Attending: Family Medicine | Admitting: Family Medicine

## 2014-04-20 DIAGNOSIS — Z853 Personal history of malignant neoplasm of breast: Secondary | ICD-10-CM | POA: Diagnosis not present

## 2014-04-20 DIAGNOSIS — R922 Inconclusive mammogram: Secondary | ICD-10-CM | POA: Diagnosis not present

## 2014-04-20 DIAGNOSIS — Z1231 Encounter for screening mammogram for malignant neoplasm of breast: Secondary | ICD-10-CM

## 2014-05-17 DIAGNOSIS — J0100 Acute maxillary sinusitis, unspecified: Secondary | ICD-10-CM | POA: Diagnosis not present

## 2014-05-19 DIAGNOSIS — J329 Chronic sinusitis, unspecified: Secondary | ICD-10-CM | POA: Diagnosis not present

## 2014-06-08 DIAGNOSIS — R0981 Nasal congestion: Secondary | ICD-10-CM | POA: Diagnosis not present

## 2014-06-08 DIAGNOSIS — G479 Sleep disorder, unspecified: Secondary | ICD-10-CM | POA: Diagnosis not present

## 2014-06-15 DIAGNOSIS — F419 Anxiety disorder, unspecified: Secondary | ICD-10-CM | POA: Diagnosis not present

## 2014-06-15 DIAGNOSIS — R0981 Nasal congestion: Secondary | ICD-10-CM | POA: Diagnosis not present

## 2014-06-24 DIAGNOSIS — F419 Anxiety disorder, unspecified: Secondary | ICD-10-CM | POA: Diagnosis not present

## 2014-06-24 DIAGNOSIS — R220 Localized swelling, mass and lump, head: Secondary | ICD-10-CM | POA: Diagnosis not present

## 2014-06-26 DIAGNOSIS — J310 Chronic rhinitis: Secondary | ICD-10-CM | POA: Diagnosis not present

## 2014-07-20 DIAGNOSIS — R0981 Nasal congestion: Secondary | ICD-10-CM | POA: Diagnosis not present

## 2014-07-20 DIAGNOSIS — F419 Anxiety disorder, unspecified: Secondary | ICD-10-CM | POA: Diagnosis not present

## 2014-07-24 ENCOUNTER — Encounter: Payer: Self-pay | Admitting: Podiatrist

## 2014-07-24 ENCOUNTER — Ambulatory Visit (INDEPENDENT_AMBULATORY_CARE_PROVIDER_SITE_OTHER): Payer: Medicare Other | Admitting: Podiatrist

## 2014-07-24 VITALS — BP 111/69 | HR 92 | Resp 12

## 2014-07-24 DIAGNOSIS — Q828 Other specified congenital malformations of skin: Secondary | ICD-10-CM

## 2014-07-24 NOTE — Progress Notes (Signed)
HPI:  Patient presents today for follow up of foot and callus care. States she still has a numbness type of feeling, swelling and general discomfort on her right foot despite extensive care in the past.  She had spoken to Dr. Doran Durand regarding surgery but he stated it was a 50 percent chance for improvement so she held off.  She has a painful callus on the bottom of this right foot.    Objective:  Patients chart is reviewed.  Vascular status reveals pedal pulses noted at 2 out of 4 dp and pt bilateral .  Neurological sensation is normal per Semmes Weinstein monofilament bilateral. Hyperkeratotic lesion is present submet 1/2 of the right foot.  It has a waxy plug and porokeratotic appearance.   Assessment:  Porokeratosis  Plan:  Discussed treatment options and alternatives.  Debridement of lesion carried out today.  salinocaine and aperture pad applied.

## 2014-08-10 DIAGNOSIS — F411 Generalized anxiety disorder: Secondary | ICD-10-CM | POA: Diagnosis not present

## 2014-08-10 DIAGNOSIS — J309 Allergic rhinitis, unspecified: Secondary | ICD-10-CM | POA: Diagnosis not present

## 2014-08-24 DIAGNOSIS — H2513 Age-related nuclear cataract, bilateral: Secondary | ICD-10-CM | POA: Diagnosis not present

## 2014-09-02 DIAGNOSIS — L820 Inflamed seborrheic keratosis: Secondary | ICD-10-CM | POA: Diagnosis not present

## 2014-09-02 DIAGNOSIS — D225 Melanocytic nevi of trunk: Secondary | ICD-10-CM | POA: Diagnosis not present

## 2014-09-07 DIAGNOSIS — F410 Panic disorder [episodic paroxysmal anxiety] without agoraphobia: Secondary | ICD-10-CM | POA: Diagnosis not present

## 2014-09-07 DIAGNOSIS — J309 Allergic rhinitis, unspecified: Secondary | ICD-10-CM | POA: Diagnosis not present

## 2014-09-07 DIAGNOSIS — F411 Generalized anxiety disorder: Secondary | ICD-10-CM | POA: Diagnosis not present

## 2014-10-06 DIAGNOSIS — F419 Anxiety disorder, unspecified: Secondary | ICD-10-CM | POA: Diagnosis not present

## 2014-12-21 DIAGNOSIS — K2970 Gastritis, unspecified, without bleeding: Secondary | ICD-10-CM | POA: Diagnosis not present

## 2014-12-23 ENCOUNTER — Other Ambulatory Visit: Payer: Self-pay | Admitting: Family Medicine

## 2014-12-23 DIAGNOSIS — R1013 Epigastric pain: Secondary | ICD-10-CM

## 2014-12-24 ENCOUNTER — Ambulatory Visit
Admission: RE | Admit: 2014-12-24 | Discharge: 2014-12-24 | Disposition: A | Discharge status: Home / Self Care | Payer: Medicare Other | Source: Ambulatory Visit | Attending: Family Medicine | Admitting: Family Medicine

## 2014-12-24 DIAGNOSIS — K6389 Other specified diseases of intestine: Secondary | ICD-10-CM | POA: Diagnosis not present

## 2014-12-24 DIAGNOSIS — K838 Other specified diseases of biliary tract: Secondary | ICD-10-CM | POA: Diagnosis not present

## 2014-12-24 DIAGNOSIS — R1013 Epigastric pain: Secondary | ICD-10-CM | POA: Diagnosis not present

## 2014-12-24 DIAGNOSIS — K5730 Diverticulosis of large intestine without perforation or abscess without bleeding: Secondary | ICD-10-CM | POA: Diagnosis not present

## 2014-12-24 MED ORDER — IOPAMIDOL (ISOVUE-300) INJECTION 61%
100.0000 mL | Freq: Once | INTRAVENOUS | Status: AC | PRN
  Administered 2014-12-24: 100 mL via INTRAVENOUS

## 2015-01-18 DIAGNOSIS — K2970 Gastritis, unspecified, without bleeding: Secondary | ICD-10-CM | POA: Diagnosis not present

## 2015-01-18 DIAGNOSIS — D0500 Lobular carcinoma in situ of unspecified breast: Secondary | ICD-10-CM | POA: Diagnosis not present

## 2015-01-18 DIAGNOSIS — M899 Disorder of bone, unspecified: Secondary | ICD-10-CM | POA: Diagnosis not present

## 2015-01-18 DIAGNOSIS — Z136 Encounter for screening for cardiovascular disorders: Secondary | ICD-10-CM | POA: Diagnosis not present

## 2015-01-18 DIAGNOSIS — R0981 Nasal congestion: Secondary | ICD-10-CM | POA: Diagnosis not present

## 2015-01-18 DIAGNOSIS — F419 Anxiety disorder, unspecified: Secondary | ICD-10-CM | POA: Diagnosis not present

## 2015-01-18 DIAGNOSIS — R300 Dysuria: Secondary | ICD-10-CM | POA: Diagnosis not present

## 2015-01-25 DIAGNOSIS — M8589 Other specified disorders of bone density and structure, multiple sites: Secondary | ICD-10-CM | POA: Diagnosis not present

## 2015-01-25 DIAGNOSIS — Z23 Encounter for immunization: Secondary | ICD-10-CM | POA: Diagnosis not present

## 2015-01-25 DIAGNOSIS — Z01411 Encounter for gynecological examination (general) (routine) with abnormal findings: Secondary | ICD-10-CM | POA: Diagnosis not present

## 2015-01-25 DIAGNOSIS — J309 Allergic rhinitis, unspecified: Secondary | ICD-10-CM | POA: Diagnosis not present

## 2015-01-25 DIAGNOSIS — Z86000 Personal history of in-situ neoplasm of breast: Secondary | ICD-10-CM | POA: Diagnosis not present

## 2015-01-25 DIAGNOSIS — F411 Generalized anxiety disorder: Secondary | ICD-10-CM | POA: Diagnosis not present

## 2015-01-25 DIAGNOSIS — Z1389 Encounter for screening for other disorder: Secondary | ICD-10-CM | POA: Diagnosis not present

## 2015-01-25 DIAGNOSIS — Z1211 Encounter for screening for malignant neoplasm of colon: Secondary | ICD-10-CM | POA: Diagnosis not present

## 2015-01-25 DIAGNOSIS — Z0000 Encounter for general adult medical examination without abnormal findings: Secondary | ICD-10-CM | POA: Diagnosis not present

## 2015-02-01 ENCOUNTER — Other Ambulatory Visit: Payer: Self-pay | Admitting: Family Medicine

## 2015-02-02 ENCOUNTER — Other Ambulatory Visit: Payer: Self-pay | Admitting: Family Medicine

## 2015-02-02 DIAGNOSIS — Z1231 Encounter for screening mammogram for malignant neoplasm of breast: Secondary | ICD-10-CM

## 2015-02-02 DIAGNOSIS — Z853 Personal history of malignant neoplasm of breast: Secondary | ICD-10-CM

## 2015-02-02 DIAGNOSIS — M8580 Other specified disorders of bone density and structure, unspecified site: Secondary | ICD-10-CM

## 2015-02-03 DIAGNOSIS — L309 Dermatitis, unspecified: Secondary | ICD-10-CM | POA: Diagnosis not present

## 2015-03-17 DIAGNOSIS — L259 Unspecified contact dermatitis, unspecified cause: Secondary | ICD-10-CM | POA: Diagnosis not present

## 2015-04-23 ENCOUNTER — Ambulatory Visit: Payer: BLUE CROSS/BLUE SHIELD

## 2015-04-23 ENCOUNTER — Other Ambulatory Visit: Payer: BLUE CROSS/BLUE SHIELD

## 2015-04-28 DIAGNOSIS — J029 Acute pharyngitis, unspecified: Secondary | ICD-10-CM | POA: Diagnosis not present

## 2015-05-04 ENCOUNTER — Inpatient Hospital Stay: Admission: RE | Admit: 2015-05-04 | Payer: BLUE CROSS/BLUE SHIELD | Source: Ambulatory Visit

## 2015-05-07 ENCOUNTER — Ambulatory Visit
Admission: RE | Admit: 2015-05-07 | Discharge: 2015-05-07 | Disposition: A | Discharge status: Home / Self Care | Payer: Medicare Other | Source: Ambulatory Visit | Attending: Family Medicine | Admitting: Family Medicine

## 2015-05-07 DIAGNOSIS — Z1231 Encounter for screening mammogram for malignant neoplasm of breast: Secondary | ICD-10-CM

## 2015-05-07 DIAGNOSIS — Z853 Personal history of malignant neoplasm of breast: Secondary | ICD-10-CM

## 2015-06-07 ENCOUNTER — Ambulatory Visit
Admission: RE | Admit: 2015-06-07 | Discharge: 2015-06-07 | Disposition: A | Discharge status: Home / Self Care | Payer: Medicare Other | Source: Ambulatory Visit | Attending: Family Medicine | Admitting: Family Medicine

## 2015-06-07 DIAGNOSIS — M8580 Other specified disorders of bone density and structure, unspecified site: Secondary | ICD-10-CM

## 2015-06-07 DIAGNOSIS — M85852 Other specified disorders of bone density and structure, left thigh: Secondary | ICD-10-CM | POA: Diagnosis not present

## 2015-07-19 ENCOUNTER — Encounter (HOSPITAL_COMMUNITY): Payer: Self-pay | Admitting: Emergency Medicine

## 2015-07-19 ENCOUNTER — Emergency Department (HOSPITAL_COMMUNITY)
Admission: EM | Admit: 2015-07-19 | Discharge: 2015-07-19 | Disposition: A | Discharge status: Home / Self Care | Payer: Medicare Other | Attending: Emergency Medicine | Admitting: Emergency Medicine

## 2015-07-19 ENCOUNTER — Emergency Department (HOSPITAL_COMMUNITY): Payer: Medicare Other

## 2015-07-19 DIAGNOSIS — Y998 Other external cause status: Secondary | ICD-10-CM | POA: Diagnosis not present

## 2015-07-19 DIAGNOSIS — M25552 Pain in left hip: Secondary | ICD-10-CM | POA: Diagnosis not present

## 2015-07-19 DIAGNOSIS — S80212A Abrasion, left knee, initial encounter: Secondary | ICD-10-CM | POA: Diagnosis not present

## 2015-07-19 DIAGNOSIS — S8002XA Contusion of left knee, initial encounter: Secondary | ICD-10-CM | POA: Diagnosis not present

## 2015-07-19 DIAGNOSIS — Z792 Long term (current) use of antibiotics: Secondary | ICD-10-CM | POA: Diagnosis not present

## 2015-07-19 DIAGNOSIS — M25522 Pain in left elbow: Secondary | ICD-10-CM | POA: Diagnosis not present

## 2015-07-19 DIAGNOSIS — Z853 Personal history of malignant neoplasm of breast: Secondary | ICD-10-CM | POA: Insufficient documentation

## 2015-07-19 DIAGNOSIS — W07XXXA Fall from chair, initial encounter: Secondary | ICD-10-CM | POA: Insufficient documentation

## 2015-07-19 DIAGNOSIS — Z8719 Personal history of other diseases of the digestive system: Secondary | ICD-10-CM | POA: Insufficient documentation

## 2015-07-19 DIAGNOSIS — M1990 Unspecified osteoarthritis, unspecified site: Secondary | ICD-10-CM | POA: Insufficient documentation

## 2015-07-19 DIAGNOSIS — S79912A Unspecified injury of left hip, initial encounter: Secondary | ICD-10-CM | POA: Insufficient documentation

## 2015-07-19 DIAGNOSIS — Y9389 Activity, other specified: Secondary | ICD-10-CM | POA: Insufficient documentation

## 2015-07-19 DIAGNOSIS — Z880 Allergy status to penicillin: Secondary | ICD-10-CM | POA: Insufficient documentation

## 2015-07-19 DIAGNOSIS — S3992XA Unspecified injury of lower back, initial encounter: Secondary | ICD-10-CM | POA: Diagnosis not present

## 2015-07-19 DIAGNOSIS — S8001XA Contusion of right knee, initial encounter: Secondary | ICD-10-CM | POA: Diagnosis not present

## 2015-07-19 DIAGNOSIS — S8991XA Unspecified injury of right lower leg, initial encounter: Secondary | ICD-10-CM | POA: Diagnosis present

## 2015-07-19 DIAGNOSIS — Y9289 Other specified places as the place of occurrence of the external cause: Secondary | ICD-10-CM | POA: Insufficient documentation

## 2015-07-19 MED ORDER — METHOCARBAMOL 500 MG PO TABS: 500.0000 mg | ORAL_TABLET | Freq: Two times a day (BID) | ORAL | Status: AC

## 2015-07-19 MED ORDER — NAPROXEN 500 MG PO TABS: 500.0000 mg | ORAL_TABLET | Freq: Two times a day (BID) | ORAL | Status: AC

## 2015-07-19 MED ORDER — NAPROXEN 500 MG PO TABS
500.0000 mg | ORAL_TABLET | Freq: Once | ORAL | Status: AC
  Administered 2015-07-19: 500 mg via ORAL
  Filled 2015-07-19: qty 1

## 2015-07-19 MED ORDER — METHOCARBAMOL 500 MG PO TABS
500.0000 mg | ORAL_TABLET | Freq: Once | ORAL | Status: AC
  Administered 2015-07-19: 500 mg via ORAL
  Filled 2015-07-19: qty 1

## 2015-07-19 NOTE — ED Provider Notes (Signed)
CSN: [id number]     Arrival date & time 07/19/15  1449 History  By signing my name below, I, Nicole Kindred, attest that this documentation has been prepared under the direction and in the presence of [employer], PA-C.  Electronically Signed: Nicole Kindred, ED Scribe 07/19/2015 at 9:15 PM.   Chief Complaint  Patient presents with  . Leg Pain  . Fall    The history is provided by the patient and medical records. No language interpreter was used.   HPI Comments: Samantha Pineda is a 70 y.o. female with PMHx of cancer, arthritis, diverticulosis, and osteopenia who presents to the Emergency Department complaining of sudden onset, constant, shooting left hip and leg pain onset earlier today after she fell off a chair onto her knees on a hardwood floor. Pt reports associated left hip pain, left buttock pain, and difficulty ambulating due to pain. Pt reports that pain is worsened with palpation, ambulation, and extended standing. No other worsening or alleviating factors noted. Pt denies leg swelling or any other pertinent symptoms. Denies weakness, numbness, paresthesias, low back pain, loss of bowel or bladder control.    Past Medical History  Diagnosis Date  . Diverticulosis   . Osteopenia   . 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 Arthritis     osteo   Past Surgical History  Procedure Laterality Date  . Tonsillectomy     . Abdominal hysterectomy    . Breast lumpectomy Right 2008    dr. Harlow Asa performed  . Cystoscopy N/A 10/21/2013    Procedure: CYSTOSCOPY;  Surgeon: Reece Packer, MD;  Location: [facility] ORS;  Service: Urology;  Laterality: N/A;  . Anterior and posterior repair N/A 10/21/2013    Procedure: REPAIR RECTOCELE, ENTEROCELE AND VAULT PROLAPSE AND GRAFT;  Surgeon: Reece Packer, MD;  Location: [facility]S;  Service: Urology;  Laterality: N/A;   Family History  Problem Relation Age of Onset  . COPD Mother   . Heart disease Father    Social History  Substance Use  Topics  . Smoking status: Never Smoker   . Smokeless tobacco: Never Used  . Alcohol Use: No   OB History    No data available     Review of Systems  Constitutional: Negative for fever, diaphoresis, appetite change, fatigue and unexpected weight change.  HENT: Negative for mouth sores.   Eyes: Negative for visual disturbance.  Respiratory: Negative for cough, chest tightness, shortness of breath and wheezing.   Cardiovascular: Negative for chest pain and leg swelling.  Gastrointestinal: Negative for nausea, vomiting, abdominal pain, diarrhea and constipation.  Endocrine: Negative for polydipsia, polyphagia and polyuria.  Genitourinary: Negative for dysuria, urgency, frequency and hematuria.  Musculoskeletal: Positive for arthralgias and gait problem. Negative for back pain, joint swelling and neck stiffness.       Pain noted to left hip and left buttock.   Skin: Negative for rash and wound.  Allergic/Immunologic: Negative for immunocompromised state.  Neurological: Negative for syncope, weakness, light-headedness, numbness and headaches.  Hematological: Does not bruise/bleed easily.  Psychiatric/Behavioral: Negative for sleep disturbance. The patient is not nervous/anxious.     Allergies  Codeine and Penicillins  Home Medications   Prior to Admission medications   Medication Sig Start Date End Date Taking? Authorizing Provider  acetaminophen (TYLENOL) 500 MG tablet Take 500 mg by mouth every 6 (six) hours as needed for moderate pain.    Historical Provider, MD  ciprofloxacin (CIPRO) 250 MG tablet Take 1  tablet (250 mg total) by mouth 2 (two) times daily. 10/21/13   Debbrah Alar, PA-C  ibandronate (BONIVA) 150 MG tablet Take 150 mg by mouth every 30 (thirty) days.  08/25/13   Historical Provider, MD  methocarbamol (ROBAXIN) 500 MG tablet Take 1 tablet (500 mg total) by mouth 2 (two) times daily. 07/19/15   Keyerra Lamere, PA-C  naproxen (NAPROSYN) 500 MG tablet Take 1 tablet  (500 mg total) by mouth 2 (two) times daily with a meal. 07/19/15   Willet Schleifer, PA-C   BP 110/80 mmHg  Pulse 87  Temp(Src) 98.7 F (37.1 C) (Oral)  Resp 16  SpO2 97% Physical Exam  Constitutional: She is oriented to person, place, and time. She appears well-developed and well-nourished. No distress.  Awake, alert, nontoxic appearance  HENT:  Head: Normocephalic and atraumatic.  Mouth/Throat: Oropharynx is clear and moist. No oropharyngeal exudate.  Eyes: Conjunctivae are normal. No scleral icterus.  Neck: Normal range of motion. Neck supple.  Cardiovascular: Normal rate, regular rhythm, normal heart sounds and intact distal pulses.   Pulses:      Dorsalis pedis pulses are 2+ on the right side, and 2+ on the left side.  Capillary refill < 3 sec  Pulmonary/Chest: Effort normal and breath sounds normal. No respiratory distress. She has no wheezes.  Equal chest expansion  Abdominal: Soft. Bowel sounds are normal. She exhibits no distension and no mass. There is no tenderness. There is no rebound and no guarding.  Musculoskeletal: Normal range of motion. She exhibits tenderness. She exhibits no edema.  No paraspinal TTP.  FROM of bilateral hips knees and ankles. TTP of left posterior buttocks with reproduction of pain during palpation of sciatic nerve. No radiation of pain, no paraesthesia.  Bilateral knees with bruising and abrastion to left knee. No joint effusion, no joint line TTP, no deformity or abnormal patellar movement.    Neurological: She is alert and oriented to person, place, and time. Gait abnormal. Coordination normal.  Speech is clear and goal oriented Moves extremities without ataxia Antalgic gait with limp due to pain. Normal balance.   Skin: Skin is warm and dry. She is not diaphoretic.  No tenting of the skin  Psychiatric: She has a normal mood and affect. Her behavior is normal.  Nursing note and vitals reviewed.   ED Course  Procedures (including  critical care time) DIAGNOSTIC STUDIES: Oxygen Saturation is 97% on RA, normal by my interpretation.    COORDINATION OF CARE: 8:34 PM-Discussed treatment plan which includes DG hip unilateral with pelvis 2-3 views left, symptom monitoring, naprosyn, robaxin, and follow up with an orthopedist with pt at bedside and pt agreed to plan.   Imaging Review Dg Hip Unilat With Pelvis 2-3 Views Left  07/19/2015  CLINICAL DATA:  [place] after standing on chair.  Pain in the left hip. EXAM: DG HIP (WITH OR WITHOUT PELVIS) 2-3V LEFT COMPARISON:  None. FINDINGS: No evidence of fracture or dislocation. There is advanced osteoarthritis of the left hip with joint space narrowing and marginal osteophytes. There is moderate osteoarthritis of the right hip. IMPRESSION: No acute or traumatic finding. 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 Electronically Signed   By: Nelson Chimes M.D.   On: 07/19/2015 16:51   I have personally reviewed and evaluated these images as part of my medical decision-making.   MDM   Final diagnoses:  Fall from chair, initial encounter  Left hip pain   BARNETTE LOFGREEN sense  with left hip pain after falling from a chair earlier this morning. She's difficulty ambulating but has full range of motion of the hip. 5/5 strength in the bilateral lower extremities.  No evidence of patellar tendon disruption.  Patient with full range of motion of bilateral knees without pain. No joint line tenderness. Highly doubt to be a plateau fracture or referred pain from her knee. Plain films were reviewed by myself without evidence of fracture. I discussed with patient the potential of a hairline fracture not visible on plain film and offered a CT scan however she declines but does wish for orthopedic follow-up in the next several days. Patient referred to [facility] orthopedics. She ambulates with assistance but has difficulty weightbearing due to pain.  Naproxen and Robaxin. Discussed reasons  to return immediately to the emergency department including numbness, weakness or loss of bowel or bladder control.  I personally performed the services described in this documentation, which was scribed in my presence. The recorded information has been reviewed and is accurate.   Jarrett Soho Xaiver Roskelley, PA-C 07/19/15 2116  Davonna Belling, MD 07/20/15 [phone]

## 2015-07-19 NOTE — ED Notes (Signed)
Pt was up on a chair changing the time on her clock and the chair fell over and patient landed on the floor on her knees. Pt sts her knees are fine but her L hip is hurting. Pt has hx of osteopenia. Pt able to move leg and bend knee without difficulty with some pain but pt limping in triage and unable to bear weight without severe pain. A&Ox4. Denies LOC, head injury.

## 2015-07-19 NOTE — ED Notes (Signed)
PA at bedside.

## 2015-07-19 NOTE — Discharge Instructions (Signed)
1. Medications: alternate naprosyn and tylenol for pain control, take robaxin as needed - muscle relaxer; usual home medications 2. Treatment: rest, ice, elevate and use brace, drink plenty of fluids, gentle stretching 3. Follow Up: Please followup with orthopedics as directed for discussion of your diagnoses and further evaluation after today's visit; if you do not have a primary care doctor use the resource guide provided to find one; Please return to the ER for worsening symptoms or other concerns    Heat Therapy Heat therapy can help ease sore, stiff, injured, and tight muscles and joints. Heat relaxes your muscles, which may help ease your pain.  RISKS AND COMPLICATIONS If you have any of the following conditions, do not use heat therapy unless your health care provider has approved:  Poor circulation.  Healing wounds or scarred skin in the area being treated.  Diabetes, heart disease, or high blood pressure.  Not being able to feel (numbness) the area being treated.  Unusual swelling of the area being treated.  Active infections.  Blood clots.  Cancer.  Inability to communicate pain. This may include young children and people who have problems with their brain function (dementia).  Pregnancy. Heat therapy should only be used on old, pre-existing, or long-lasting (chronic) injuries. Do not use heat therapy on new injuries unless directed by your health care provider. HOW TO USE HEAT THERAPY There are several different kinds of heat therapy, including:  Moist heat pack.  Warm water bath.  Hot water bottle.  Electric heating pad.  Heated gel pack.  Heated wrap.  Electric heating pad. Use the heat therapy method suggested by your health care provider. Follow your health care provider's instructions on when and how to use heat therapy. GENERAL HEAT THERAPY RECOMMENDATIONS  Do not sleep while using heat therapy. Only use heat therapy while you are awake.  Your skin  may turn pink while using heat therapy. Do not use heat therapy if your skin turns red.  Do not use heat therapy if you have new pain.  High heat or long exposure to heat can cause burns. Be careful when using heat therapy to avoid burning your skin.  Do not use heat therapy on areas of your skin that are already irritated, such as with a rash or sunburn. SEEK MEDICAL CARE IF:  You have blisters, redness, swelling, or numbness.  You have new pain.  Your pain is worse. MAKE SURE YOU:  Understand these instructions.  Will watch your condition.  Will get help right away if you are not doing well or get worse.   This information is not intended to replace advice given to you by your health care provider. Make sure you discuss any questions you have with your health care provider.   Document Released: 07/17/2011 Document Revised: 05/15/2014 Document Reviewed: 06/17/2013 Elsevier Interactive Patient Education [employer].

## 2015-07-20 DIAGNOSIS — M545 Low back pain: Secondary | ICD-10-CM | POA: Diagnosis not present

## 2015-07-20 DIAGNOSIS — M1612 Unilateral primary osteoarthritis, left hip: Secondary | ICD-10-CM | POA: Diagnosis not present

## 2015-07-29 DIAGNOSIS — M25552 Pain in left hip: Secondary | ICD-10-CM | POA: Diagnosis not present

## 2015-08-31 DIAGNOSIS — M25552 Pain in left hip: Secondary | ICD-10-CM | POA: Diagnosis not present

## 2015-08-31 DIAGNOSIS — Z01818 Encounter for other preprocedural examination: Secondary | ICD-10-CM | POA: Diagnosis not present

## 2015-08-31 DIAGNOSIS — M25559 Pain in unspecified hip: Secondary | ICD-10-CM | POA: Diagnosis not present

## 2015-09-10 DIAGNOSIS — H2513 Age-related nuclear cataract, bilateral: Secondary | ICD-10-CM | POA: Diagnosis not present

## 2015-10-11 DIAGNOSIS — Z471 Aftercare following joint replacement surgery: Secondary | ICD-10-CM | POA: Diagnosis not present

## 2015-10-11 DIAGNOSIS — Z96642 Presence of left artificial hip joint: Secondary | ICD-10-CM | POA: Diagnosis not present

## 2015-10-11 DIAGNOSIS — Z885 Allergy status to narcotic agent status: Secondary | ICD-10-CM | POA: Diagnosis not present

## 2015-10-11 DIAGNOSIS — Z7983 Long term (current) use of bisphosphonates: Secondary | ICD-10-CM | POA: Diagnosis not present

## 2015-10-11 DIAGNOSIS — F419 Anxiety disorder, unspecified: Secondary | ICD-10-CM | POA: Diagnosis not present

## 2015-10-11 DIAGNOSIS — Z79899 Other long term (current) drug therapy: Secondary | ICD-10-CM | POA: Diagnosis not present

## 2015-10-11 DIAGNOSIS — M8580 Other specified disorders of bone density and structure, unspecified site: Secondary | ICD-10-CM | POA: Diagnosis not present

## 2015-10-11 DIAGNOSIS — M1612 Unilateral primary osteoarthritis, left hip: Secondary | ICD-10-CM | POA: Diagnosis not present

## 2015-10-11 DIAGNOSIS — Z791 Long term (current) use of non-steroidal anti-inflammatories (NSAID): Secondary | ICD-10-CM | POA: Diagnosis not present

## 2015-10-11 DIAGNOSIS — Z882 Allergy status to sulfonamides status: Secondary | ICD-10-CM | POA: Diagnosis not present

## 2015-11-25 DIAGNOSIS — Z96642 Presence of left artificial hip joint: Secondary | ICD-10-CM | POA: Diagnosis not present

## 2016-01-13 DIAGNOSIS — Z96642 Presence of left artificial hip joint: Secondary | ICD-10-CM | POA: Diagnosis not present

## 2016-01-24 DIAGNOSIS — Z01818 Encounter for other preprocedural examination: Secondary | ICD-10-CM | POA: Diagnosis not present

## 2016-01-24 DIAGNOSIS — Z0000 Encounter for general adult medical examination without abnormal findings: Secondary | ICD-10-CM | POA: Diagnosis not present

## 2016-01-24 DIAGNOSIS — M8589 Other specified disorders of bone density and structure, multiple sites: Secondary | ICD-10-CM | POA: Diagnosis not present

## 2016-01-24 DIAGNOSIS — Z23 Encounter for immunization: Secondary | ICD-10-CM | POA: Diagnosis not present

## 2016-01-24 DIAGNOSIS — M859 Disorder of bone density and structure, unspecified: Secondary | ICD-10-CM | POA: Diagnosis not present

## 2016-01-24 DIAGNOSIS — J309 Allergic rhinitis, unspecified: Secondary | ICD-10-CM | POA: Diagnosis not present

## 2016-01-24 DIAGNOSIS — F411 Generalized anxiety disorder: Secondary | ICD-10-CM | POA: Diagnosis not present

## 2016-01-24 DIAGNOSIS — Z1389 Encounter for screening for other disorder: Secondary | ICD-10-CM | POA: Diagnosis not present

## 2016-01-24 DIAGNOSIS — J029 Acute pharyngitis, unspecified: Secondary | ICD-10-CM | POA: Diagnosis not present

## 2016-01-24 DIAGNOSIS — Z86000 Personal history of in-situ neoplasm of breast: Secondary | ICD-10-CM | POA: Diagnosis not present

## 2016-01-24 DIAGNOSIS — Z1211 Encounter for screening for malignant neoplasm of colon: Secondary | ICD-10-CM | POA: Diagnosis not present

## 2016-01-24 DIAGNOSIS — Z1159 Encounter for screening for other viral diseases: Secondary | ICD-10-CM | POA: Diagnosis not present

## 2016-01-27 DIAGNOSIS — M8580 Other specified disorders of bone density and structure, unspecified site: Secondary | ICD-10-CM | POA: Diagnosis not present

## 2016-01-27 DIAGNOSIS — J309 Allergic rhinitis, unspecified: Secondary | ICD-10-CM | POA: Diagnosis not present

## 2016-01-27 DIAGNOSIS — Z1389 Encounter for screening for other disorder: Secondary | ICD-10-CM | POA: Diagnosis not present

## 2016-01-27 DIAGNOSIS — Z0000 Encounter for general adult medical examination without abnormal findings: Secondary | ICD-10-CM | POA: Diagnosis not present

## 2016-01-27 DIAGNOSIS — Z96642 Presence of left artificial hip joint: Secondary | ICD-10-CM | POA: Diagnosis not present

## 2016-01-27 DIAGNOSIS — F411 Generalized anxiety disorder: Secondary | ICD-10-CM | POA: Diagnosis not present

## 2016-01-27 DIAGNOSIS — Z86000 Personal history of in-situ neoplasm of breast: Secondary | ICD-10-CM | POA: Diagnosis not present

## 2016-01-27 DIAGNOSIS — Z23 Encounter for immunization: Secondary | ICD-10-CM | POA: Diagnosis not present

## 2016-02-21 DIAGNOSIS — M47816 Spondylosis without myelopathy or radiculopathy, lumbar region: Secondary | ICD-10-CM | POA: Diagnosis not present

## 2016-03-28 DIAGNOSIS — S93401A Sprain of unspecified ligament of right ankle, initial encounter: Secondary | ICD-10-CM | POA: Diagnosis not present

## 2016-04-05 ENCOUNTER — Other Ambulatory Visit: Payer: Self-pay | Admitting: Family Medicine

## 2016-04-05 DIAGNOSIS — Z1231 Encounter for screening mammogram for malignant neoplasm of breast: Secondary | ICD-10-CM

## 2016-04-23 IMAGING — MG MM DIAGNOSTIC BILATERAL
7 series · 7 of 7 positions shown · non-contrast
Comparison: With priors.

CLINICAL DATA: History of right breast cancer status post
lumpectomy and radiation therapy in 4662.

EXAM:
DIGITAL DIAGNOSTIC  BILATERAL MAMMOGRAM WITH CAD
ULTRASOUND RIGHT BREAST

[R CC (1 of 3)]
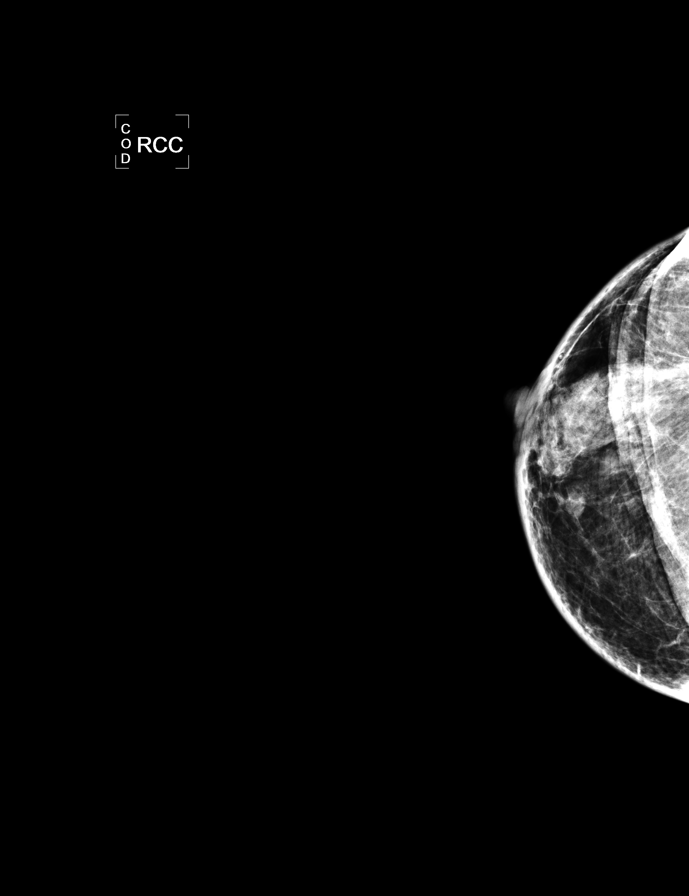

[L CC]
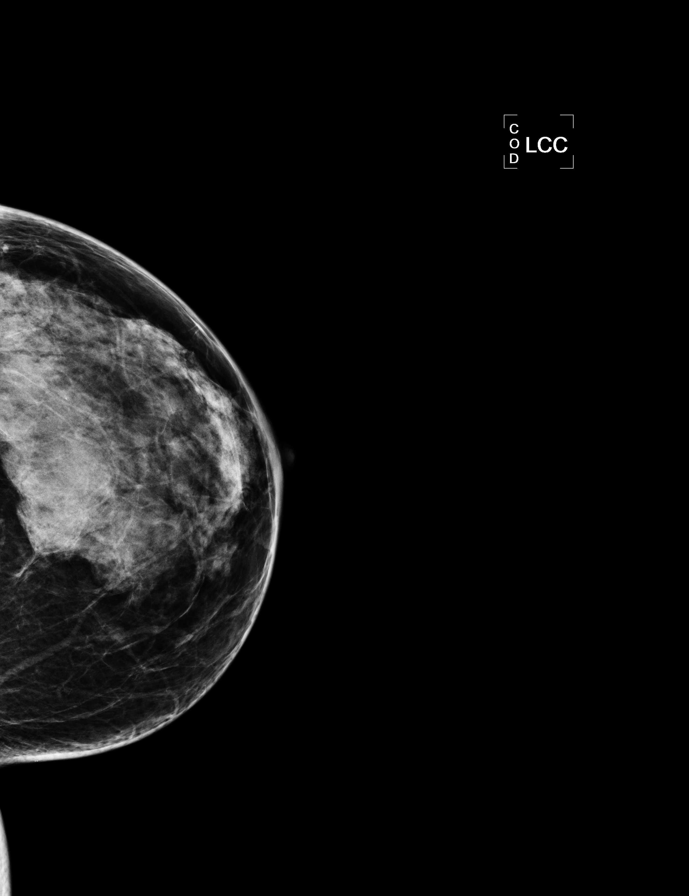

[R MLO]
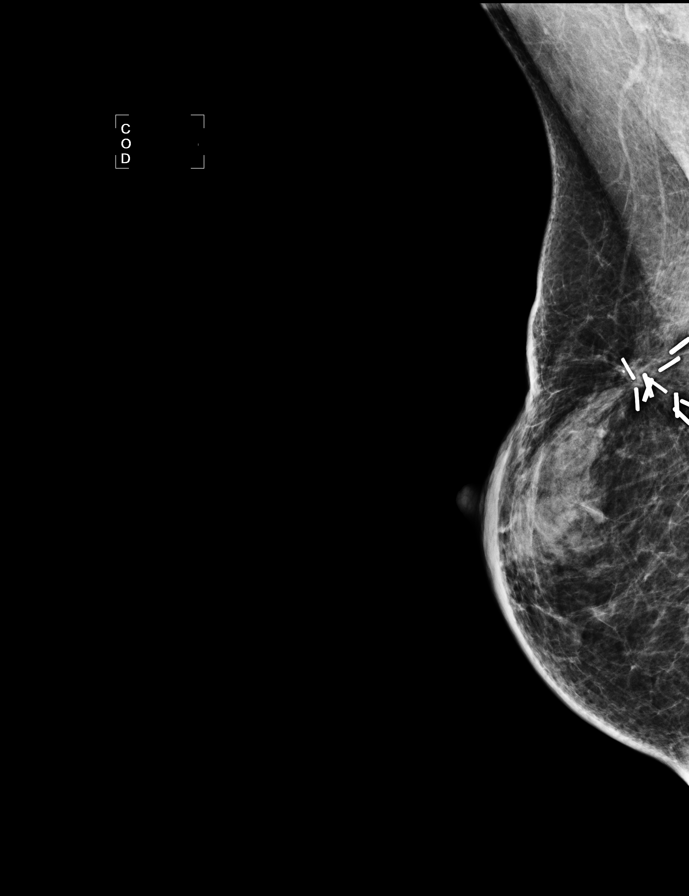

[L MLO]
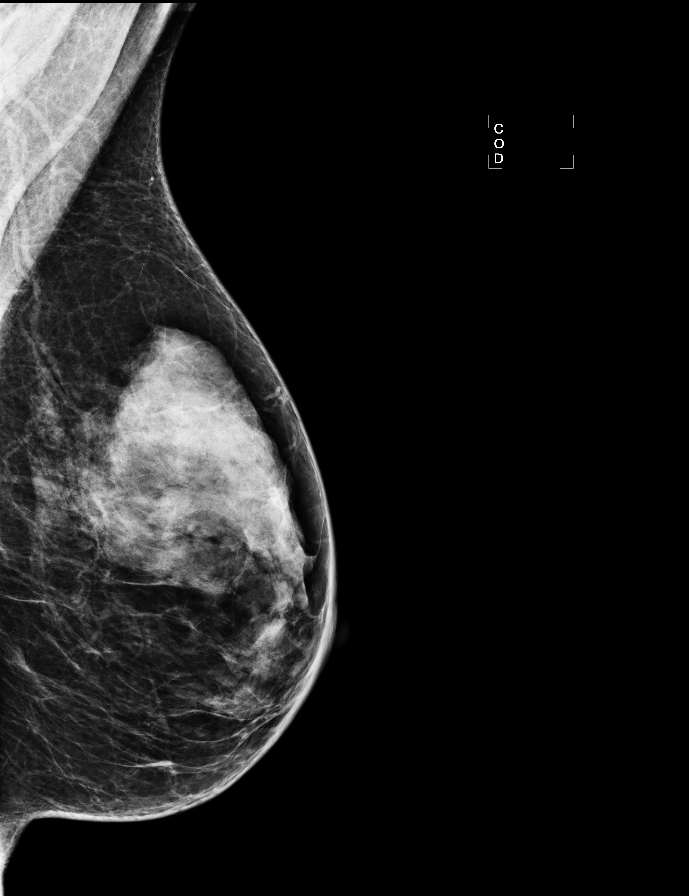

[R TAN]
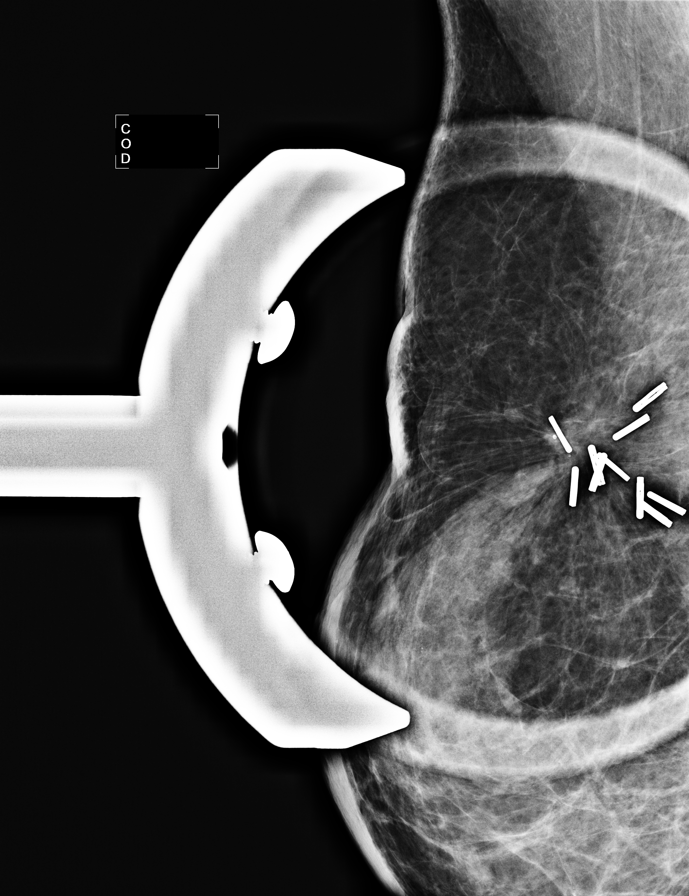

[R CC (2 of 3)]
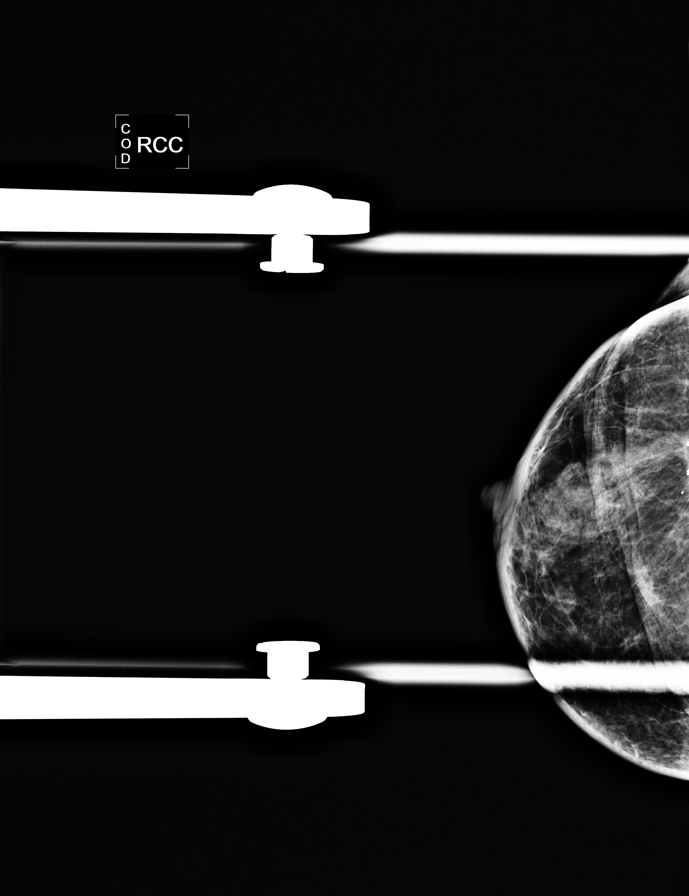

[R CC (3 of 3)]
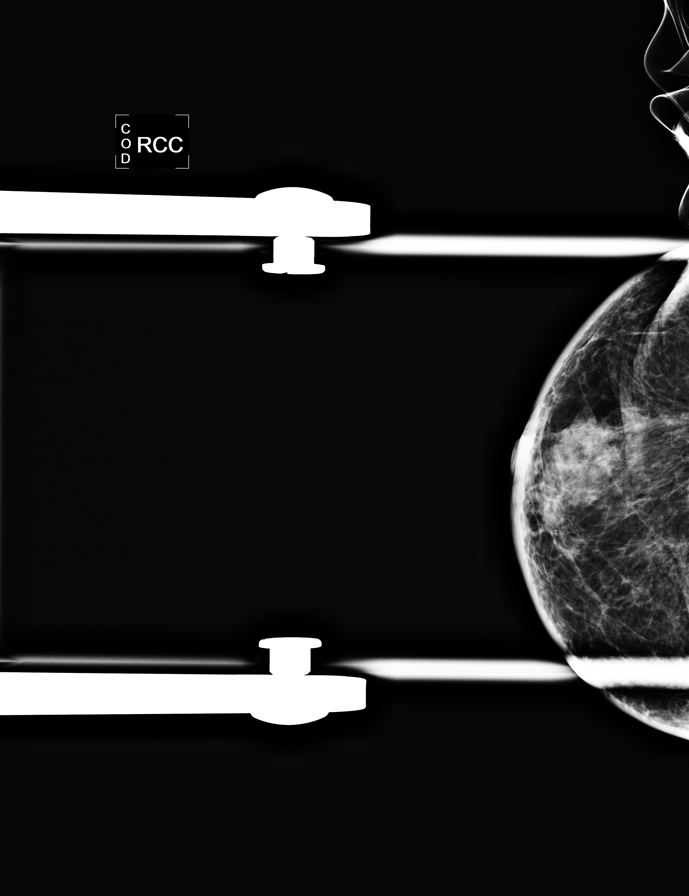

[7 of 7 positions shown; findings below may reference images not displayed]

ACR Breast Density Category c: The breast tissue is heterogeneously
dense, which may obscure small masses.
FINDINGS: Stable lumpectomy changes are seen in the right breast. On the CC
view there is a 5 mm asymmetry in the medial aspect of the breast.
Spot compression view shows no persistent of the asymmetry. The left
breast is negative. There are no malignant type microcalcifications
in either breast.

Mammographic images were processed with CAD.

On physical exam, I do not palpate a mass in the medial aspect of
the right breast.

Ultrasound is performed, showing normal tissue in the medial aspect
of the right breast. No solid or cystic mass, abnormal shadowing or
distortion visualized.
IMPRESSION: No evidence of malignancy in either breast.

RECOMMENDATION:
Bilateral screening mammogram in 1 year is recommended.

I have discussed the findings and recommendations with the patient.
Results were also provided in writing at the conclusion of the
visit. If applicable, a reminder letter will be sent to the patient
regarding the next appointment.

BI-RADS CATEGORY  2: Benign.

## 2016-05-11 ENCOUNTER — Ambulatory Visit
Admission: RE | Admit: 2016-05-11 | Discharge: 2016-05-11 | Disposition: A | Discharge status: Home / Self Care | Payer: Medicare Other | Source: Ambulatory Visit | Attending: Family Medicine | Admitting: Family Medicine

## 2016-05-11 DIAGNOSIS — Z1231 Encounter for screening mammogram for malignant neoplasm of breast: Secondary | ICD-10-CM | POA: Diagnosis not present

## 2016-07-20 DIAGNOSIS — M47816 Spondylosis without myelopathy or radiculopathy, lumbar region: Secondary | ICD-10-CM | POA: Diagnosis not present

## 2016-08-01 DIAGNOSIS — M47816 Spondylosis without myelopathy or radiculopathy, lumbar region: Secondary | ICD-10-CM | POA: Diagnosis not present

## 2016-08-01 DIAGNOSIS — M5126 Other intervertebral disc displacement, lumbar region: Secondary | ICD-10-CM | POA: Diagnosis not present

## 2016-08-01 DIAGNOSIS — M7138 Other bursal cyst, other site: Secondary | ICD-10-CM | POA: Diagnosis not present

## 2016-08-01 DIAGNOSIS — M5127 Other intervertebral disc displacement, lumbosacral region: Secondary | ICD-10-CM | POA: Diagnosis not present

## 2016-08-09 DIAGNOSIS — M47816 Spondylosis without myelopathy or radiculopathy, lumbar region: Secondary | ICD-10-CM | POA: Diagnosis not present

## 2016-08-16 DIAGNOSIS — M47816 Spondylosis without myelopathy or radiculopathy, lumbar region: Secondary | ICD-10-CM | POA: Diagnosis not present

## 2016-09-07 DIAGNOSIS — M545 Low back pain: Secondary | ICD-10-CM | POA: Diagnosis not present

## 2016-09-07 DIAGNOSIS — M47816 Spondylosis without myelopathy or radiculopathy, lumbar region: Secondary | ICD-10-CM | POA: Diagnosis not present

## 2016-09-11 DIAGNOSIS — R262 Difficulty in walking, not elsewhere classified: Secondary | ICD-10-CM | POA: Diagnosis not present

## 2016-09-11 DIAGNOSIS — M47896 Other spondylosis, lumbar region: Secondary | ICD-10-CM | POA: Diagnosis not present

## 2016-09-14 DIAGNOSIS — H25813 Combined forms of age-related cataract, bilateral: Secondary | ICD-10-CM | POA: Diagnosis not present

## 2016-09-14 DIAGNOSIS — R262 Difficulty in walking, not elsewhere classified: Secondary | ICD-10-CM | POA: Diagnosis not present

## 2016-09-14 DIAGNOSIS — M47896 Other spondylosis, lumbar region: Secondary | ICD-10-CM | POA: Diagnosis not present

## 2016-09-18 DIAGNOSIS — R262 Difficulty in walking, not elsewhere classified: Secondary | ICD-10-CM | POA: Diagnosis not present

## 2016-09-18 DIAGNOSIS — M47896 Other spondylosis, lumbar region: Secondary | ICD-10-CM | POA: Diagnosis not present

## 2016-09-21 DIAGNOSIS — M47896 Other spondylosis, lumbar region: Secondary | ICD-10-CM | POA: Diagnosis not present

## 2016-09-21 DIAGNOSIS — R262 Difficulty in walking, not elsewhere classified: Secondary | ICD-10-CM | POA: Diagnosis not present

## 2016-09-26 DIAGNOSIS — R262 Difficulty in walking, not elsewhere classified: Secondary | ICD-10-CM | POA: Diagnosis not present

## 2016-09-26 DIAGNOSIS — M47896 Other spondylosis, lumbar region: Secondary | ICD-10-CM | POA: Diagnosis not present

## 2016-09-28 DIAGNOSIS — R262 Difficulty in walking, not elsewhere classified: Secondary | ICD-10-CM | POA: Diagnosis not present

## 2016-09-28 DIAGNOSIS — M47896 Other spondylosis, lumbar region: Secondary | ICD-10-CM | POA: Diagnosis not present

## 2016-10-03 DIAGNOSIS — M47896 Other spondylosis, lumbar region: Secondary | ICD-10-CM | POA: Diagnosis not present

## 2016-10-03 DIAGNOSIS — R262 Difficulty in walking, not elsewhere classified: Secondary | ICD-10-CM | POA: Diagnosis not present

## 2016-10-05 DIAGNOSIS — M47896 Other spondylosis, lumbar region: Secondary | ICD-10-CM | POA: Diagnosis not present

## 2016-10-05 DIAGNOSIS — R262 Difficulty in walking, not elsewhere classified: Secondary | ICD-10-CM | POA: Diagnosis not present

## 2016-10-06 DIAGNOSIS — M47816 Spondylosis without myelopathy or radiculopathy, lumbar region: Secondary | ICD-10-CM | POA: Diagnosis not present

## 2016-10-06 DIAGNOSIS — M545 Low back pain: Secondary | ICD-10-CM | POA: Diagnosis not present

## 2016-10-12 DIAGNOSIS — R262 Difficulty in walking, not elsewhere classified: Secondary | ICD-10-CM | POA: Diagnosis not present

## 2016-10-12 DIAGNOSIS — M47896 Other spondylosis, lumbar region: Secondary | ICD-10-CM | POA: Diagnosis not present

## 2016-10-17 DIAGNOSIS — M47896 Other spondylosis, lumbar region: Secondary | ICD-10-CM | POA: Diagnosis not present

## 2016-10-17 DIAGNOSIS — R262 Difficulty in walking, not elsewhere classified: Secondary | ICD-10-CM | POA: Diagnosis not present

## 2016-10-19 DIAGNOSIS — M47896 Other spondylosis, lumbar region: Secondary | ICD-10-CM | POA: Diagnosis not present

## 2016-10-19 DIAGNOSIS — R262 Difficulty in walking, not elsewhere classified: Secondary | ICD-10-CM | POA: Diagnosis not present

## 2016-10-23 DIAGNOSIS — R262 Difficulty in walking, not elsewhere classified: Secondary | ICD-10-CM | POA: Diagnosis not present

## 2016-10-23 DIAGNOSIS — M47896 Other spondylosis, lumbar region: Secondary | ICD-10-CM | POA: Diagnosis not present

## 2016-10-25 DIAGNOSIS — R262 Difficulty in walking, not elsewhere classified: Secondary | ICD-10-CM | POA: Diagnosis not present

## 2016-10-25 DIAGNOSIS — M47896 Other spondylosis, lumbar region: Secondary | ICD-10-CM | POA: Diagnosis not present

## 2016-10-26 DIAGNOSIS — M545 Low back pain: Secondary | ICD-10-CM | POA: Diagnosis not present

## 2016-10-26 DIAGNOSIS — M47816 Spondylosis without myelopathy or radiculopathy, lumbar region: Secondary | ICD-10-CM | POA: Diagnosis not present

## 2016-10-27 DIAGNOSIS — Z96642 Presence of left artificial hip joint: Secondary | ICD-10-CM | POA: Diagnosis not present

## 2016-10-30 DIAGNOSIS — R262 Difficulty in walking, not elsewhere classified: Secondary | ICD-10-CM | POA: Diagnosis not present

## 2016-10-30 DIAGNOSIS — M47896 Other spondylosis, lumbar region: Secondary | ICD-10-CM | POA: Diagnosis not present

## 2016-11-01 DIAGNOSIS — M47896 Other spondylosis, lumbar region: Secondary | ICD-10-CM | POA: Diagnosis not present

## 2016-11-01 DIAGNOSIS — R262 Difficulty in walking, not elsewhere classified: Secondary | ICD-10-CM | POA: Diagnosis not present

## 2016-11-06 DIAGNOSIS — M545 Low back pain: Secondary | ICD-10-CM | POA: Diagnosis not present

## 2016-11-06 DIAGNOSIS — M47816 Spondylosis without myelopathy or radiculopathy, lumbar region: Secondary | ICD-10-CM | POA: Diagnosis not present

## 2016-12-27 IMAGING — CT CT ABD-PELV W/ CM
2 of 5 series · 15 of 46 positions shown, 17 images · IV contrast (APPLIED)
Comparison: None.

ADDENDUM:
Next impression:

There is a left para-aortic retroperitoneal lymph node measures 2 by
1.4 cm. Although this may be reactive pathologic adenopathy cannot
be excluded. Clinical correlation is necessary. Further evaluation
is recommended. Another option would be a follow-up CT scan in 2
months to assure stability or resolution.
CLINICAL DATA: Epigastric pain, pancreatitis, possible foot
poisoning 5 days ago, diarrhea, history of diverticulosis
EXAM:
CT ABDOMEN AND PELVIS WITH CONTRAST
TECHNIQUE: Multidetector CT imaging of the abdomen and pelvis was performed
using the standard protocol following bolus administration of
intravenous contrast.
CONTRAST:  100mL [id number] IOPAMIDOL ([id number]) INJECTION 61%

[Series 2: abd pelvis 5.0 i41s 1 · axial · 0.63mm/px · z∈[-429,-54]mm · 12 of 85 slices shown, 14 images]
[im 5/85  soft-tissue]
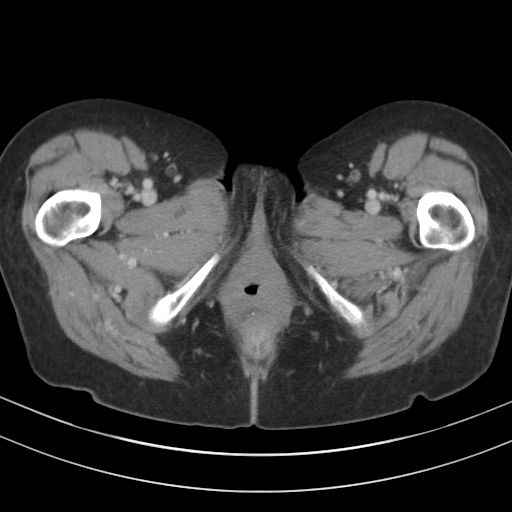
[im 5/85  bone]
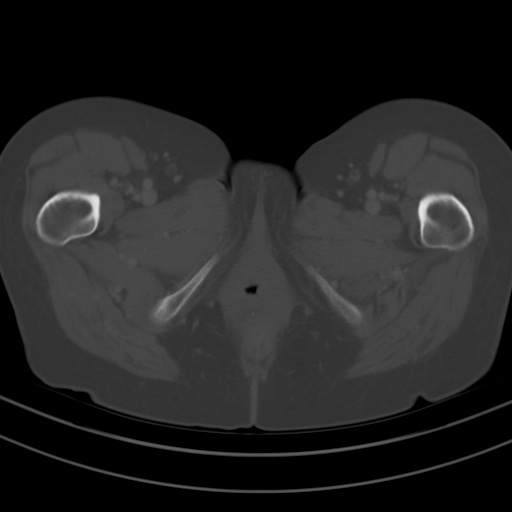
[im 15/85  soft-tissue]
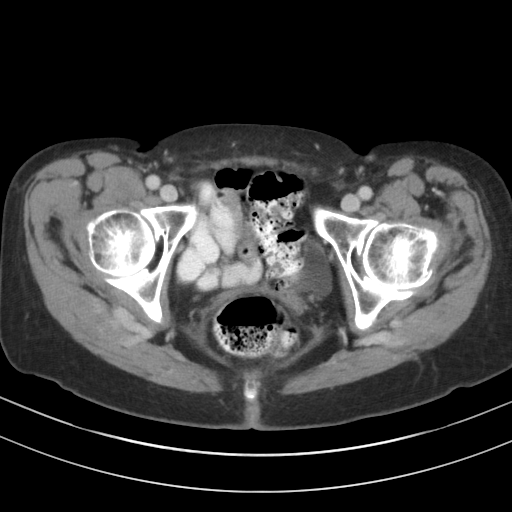
[im 19/85  soft-tissue]
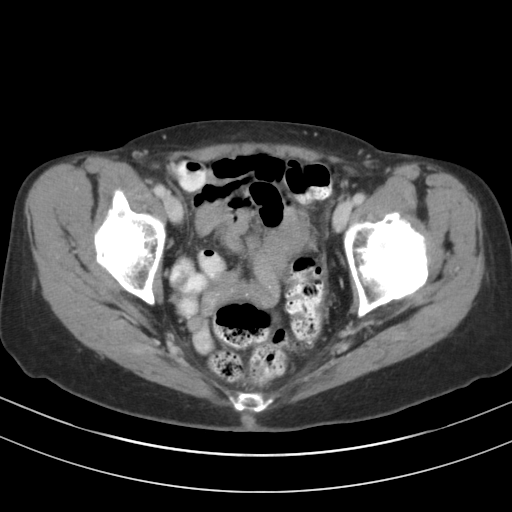
[im 24/85  soft-tissue]
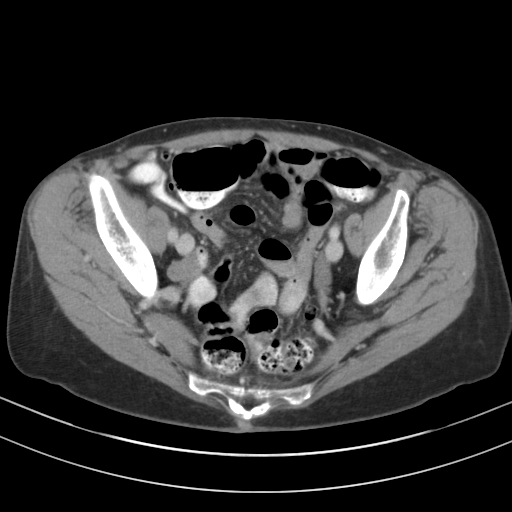
[im 33/85  soft-tissue]
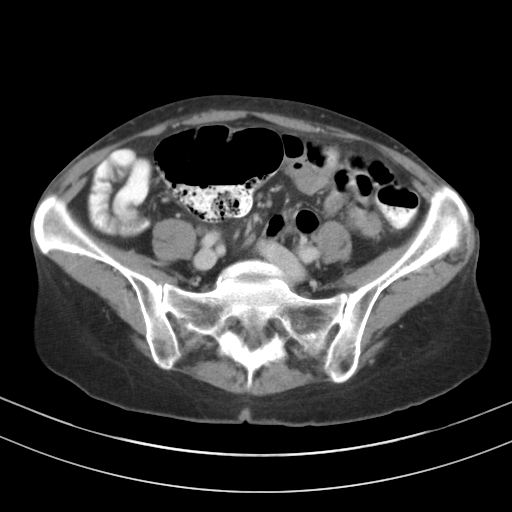
[im 38/85  soft-tissue]
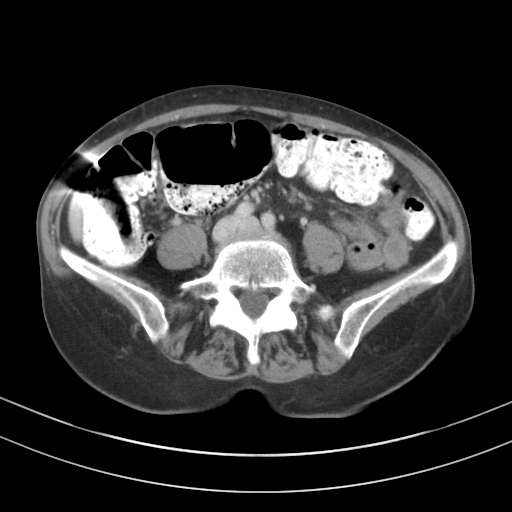
[im 47/85  soft-tissue]
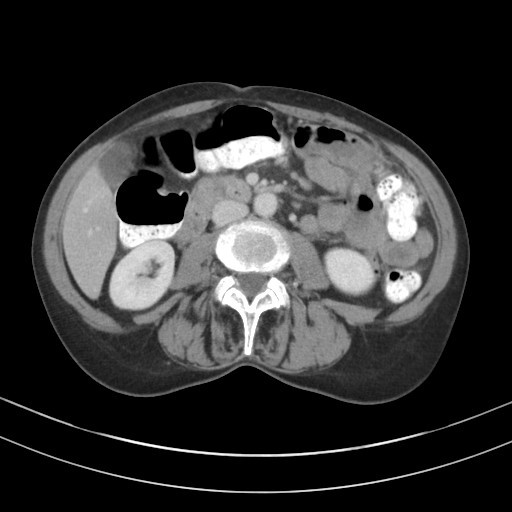
[im 52/85  soft-tissue]
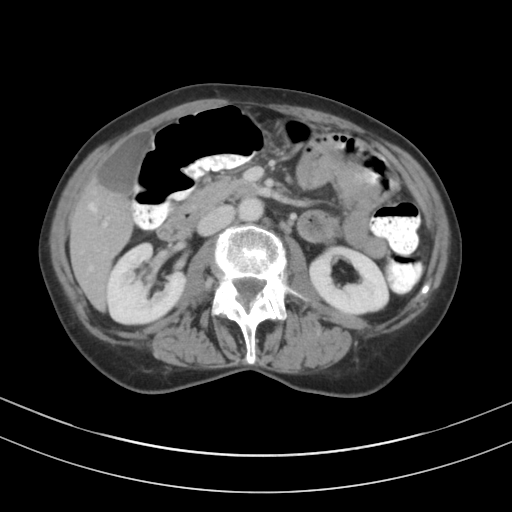
[im 61/85  soft-tissue]
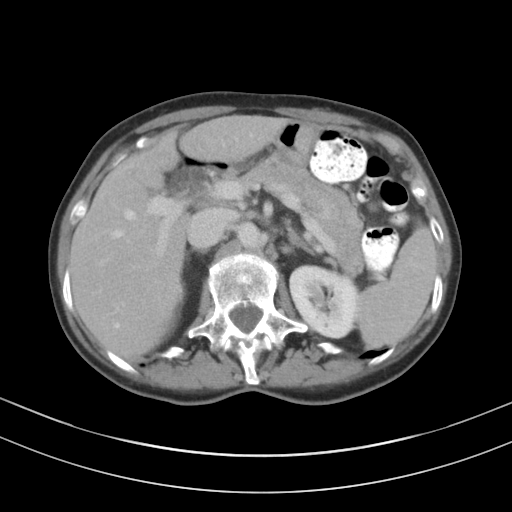
[im 61/85  bone]
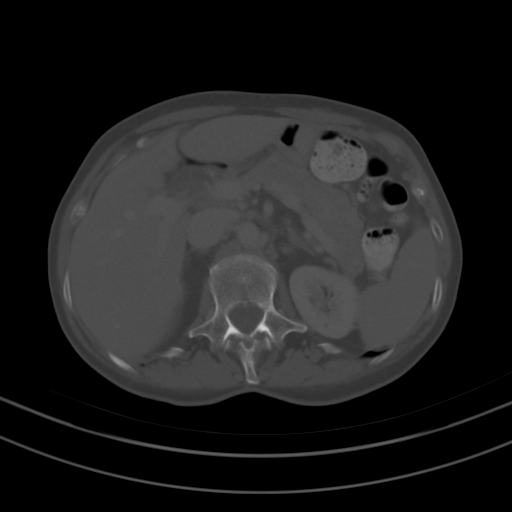
[im 66/85  soft-tissue]
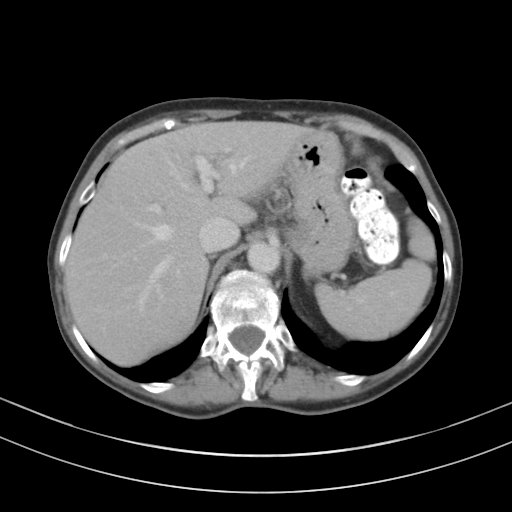
[im 71/85  soft-tissue]
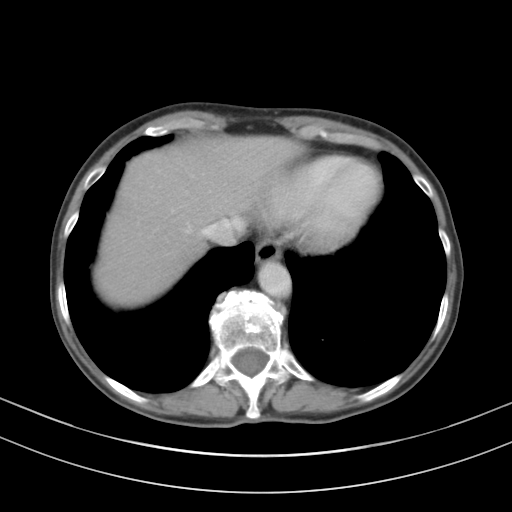
[im 80/85  soft-tissue]
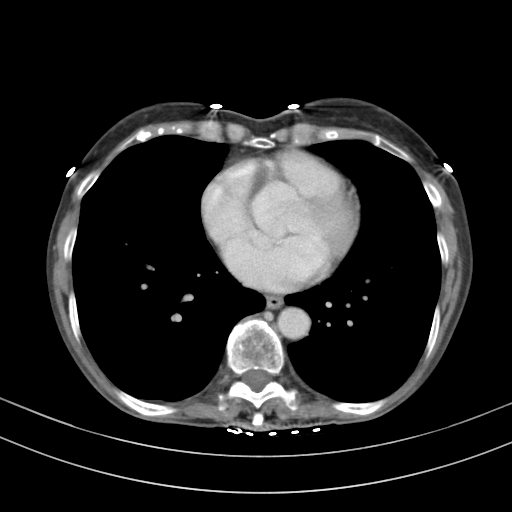

[Series 3: abd pelvis 3.0 spo cor · coronal · 0.63mm/px · 3 of 71 slices shown]
[im 24/71  soft-tissue]
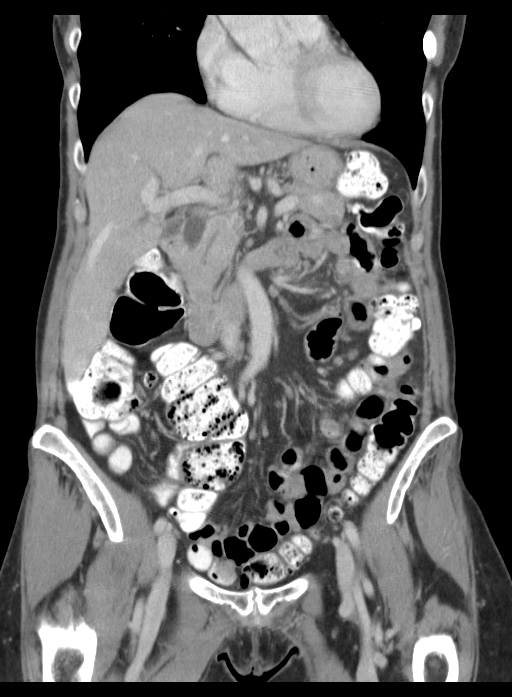
[im 32/71  soft-tissue]
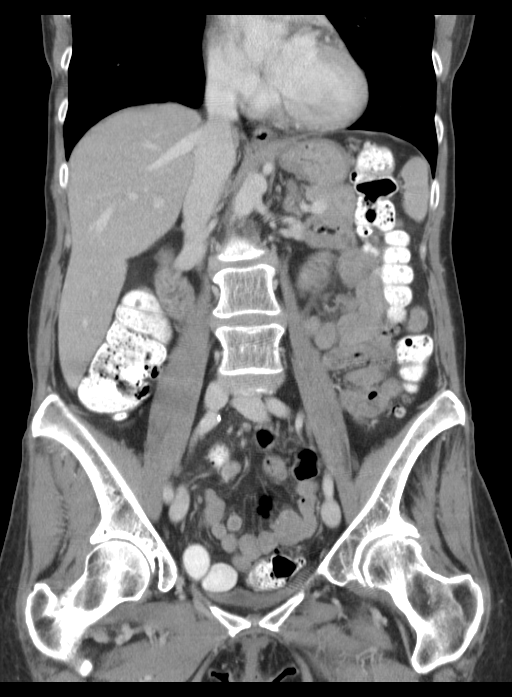
[im 39/71  soft-tissue]
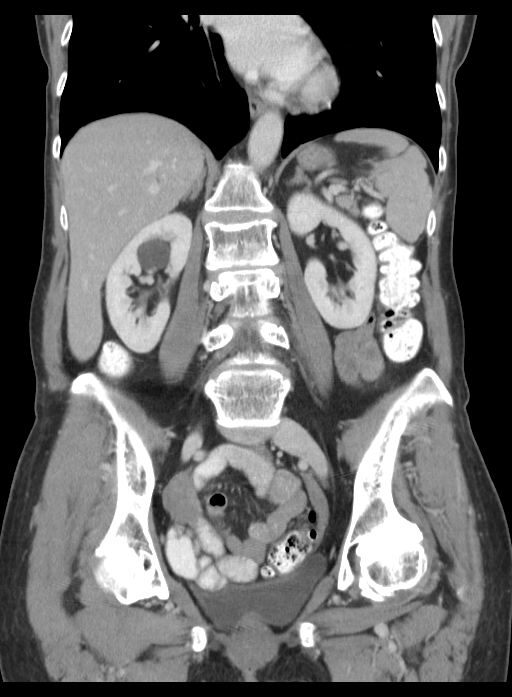

[15 of 46 positions shown; findings below may reference images not displayed]

FINDINGS: The lung bases are unremarkable. Sagittal images of the spine shows
degenerative changes lower thoracic spine. There is bony fusion of
T10-T11 vertebral bodies. The enhanced liver is unremarkable. No
calcified gallstones are noted within gallbladder. CBD measures
mm in diameter. The pancreas, spleen and adrenal glands are
unremarkable. A cyst in right kidney measures 2 cm. Tiny cyst in
upper pole of the left kidney measures 4 mm. Kidneys are symmetrical
in enhancement. No hydronephrosis or hydroureter. Bilateral
visualized proximal ureter is unremarkable.

No aortic aneurysm. There is a left retroperitoneal para-aortic
lymph node measures 1.5 cm. This is pathologic by size criteria.

No small bowel obstruction. No ascites or free air. Colonic
diverticulosis is noted. No evidence of acute diverticulitis. There
is moderate distension of the cecum with liquid stool and gas. The
cecum measures about 8 cm in diameter. There is no pericecal
inflammation. There is a low lying cecum. The terminal ileum is
unremarkable.

Moderate stool noted within sigmoid colon and rectum. The urinary
bladder is unremarkable.

Delayed renal images shows bilateral renal symmetrical excretion.
IMPRESSION: 1. No hydronephrosis or hydroureter.
2. Low lying cecum. No pericecal inflammation. Moderate distension
of the cecum with with liquid stool and gas.
3. Colonic diverticulosis.  No evidence of acute diverticulitis.
4. No small bowel obstruction.
5. Degenerative changes lower thoracic spine.
6. Mild CBD dilatation without intrahepatic biliary ductal
dilatation.

## 2017-01-04 DIAGNOSIS — S20212A Contusion of left front wall of thorax, initial encounter: Secondary | ICD-10-CM | POA: Diagnosis not present

## 2017-01-26 DIAGNOSIS — J309 Allergic rhinitis, unspecified: Secondary | ICD-10-CM | POA: Diagnosis not present

## 2017-01-26 DIAGNOSIS — Z1159 Encounter for screening for other viral diseases: Secondary | ICD-10-CM | POA: Diagnosis not present

## 2017-01-26 DIAGNOSIS — Z0000 Encounter for general adult medical examination without abnormal findings: Secondary | ICD-10-CM | POA: Diagnosis not present

## 2017-01-26 DIAGNOSIS — M8580 Other specified disorders of bone density and structure, unspecified site: Secondary | ICD-10-CM | POA: Diagnosis not present

## 2017-01-26 DIAGNOSIS — M85852 Other specified disorders of bone density and structure, left thigh: Secondary | ICD-10-CM | POA: Diagnosis not present

## 2017-01-26 DIAGNOSIS — F411 Generalized anxiety disorder: Secondary | ICD-10-CM | POA: Diagnosis not present

## 2017-01-26 DIAGNOSIS — Z86000 Personal history of in-situ neoplasm of breast: Secondary | ICD-10-CM | POA: Diagnosis not present

## 2017-01-26 DIAGNOSIS — Z96642 Presence of left artificial hip joint: Secondary | ICD-10-CM | POA: Diagnosis not present

## 2017-01-26 DIAGNOSIS — Z23 Encounter for immunization: Secondary | ICD-10-CM | POA: Diagnosis not present

## 2017-01-31 DIAGNOSIS — Z23 Encounter for immunization: Secondary | ICD-10-CM | POA: Diagnosis not present

## 2017-01-31 DIAGNOSIS — M85852 Other specified disorders of bone density and structure, left thigh: Secondary | ICD-10-CM | POA: Diagnosis not present

## 2017-01-31 DIAGNOSIS — Z86000 Personal history of in-situ neoplasm of breast: Secondary | ICD-10-CM | POA: Diagnosis not present

## 2017-01-31 DIAGNOSIS — Z131 Encounter for screening for diabetes mellitus: Secondary | ICD-10-CM | POA: Diagnosis not present

## 2017-01-31 DIAGNOSIS — F411 Generalized anxiety disorder: Secondary | ICD-10-CM | POA: Diagnosis not present

## 2017-01-31 DIAGNOSIS — Z0000 Encounter for general adult medical examination without abnormal findings: Secondary | ICD-10-CM | POA: Diagnosis not present

## 2017-04-06 ENCOUNTER — Other Ambulatory Visit: Payer: Self-pay | Admitting: Family Medicine

## 2017-04-06 DIAGNOSIS — M85852 Other specified disorders of bone density and structure, left thigh: Secondary | ICD-10-CM

## 2017-04-09 ENCOUNTER — Other Ambulatory Visit: Payer: Self-pay | Admitting: Family Medicine

## 2017-04-09 DIAGNOSIS — Z1231 Encounter for screening mammogram for malignant neoplasm of breast: Secondary | ICD-10-CM

## 2017-05-14 ENCOUNTER — Ambulatory Visit
Admission: RE | Admit: 2017-05-14 | Discharge: 2017-05-14 | Disposition: A | Discharge status: Home / Self Care | Payer: Medicare Other | Source: Ambulatory Visit | Attending: Family Medicine | Admitting: Family Medicine

## 2017-05-14 DIAGNOSIS — Z1231 Encounter for screening mammogram for malignant neoplasm of breast: Secondary | ICD-10-CM

## 2017-05-14 HISTORY — DX: Malignant neoplasm of unspecified site of unspecified female breast: C50.919

## 2017-06-11 ENCOUNTER — Ambulatory Visit
Admission: RE | Admit: 2017-06-11 | Discharge: 2017-06-11 | Disposition: A | Discharge status: Home / Self Care | Payer: Medicare Other | Source: Ambulatory Visit | Attending: Family Medicine | Admitting: Family Medicine

## 2017-06-11 DIAGNOSIS — Z780 Asymptomatic menopausal state: Secondary | ICD-10-CM | POA: Diagnosis not present

## 2017-06-11 DIAGNOSIS — M8589 Other specified disorders of bone density and structure, multiple sites: Secondary | ICD-10-CM | POA: Diagnosis not present

## 2017-06-11 DIAGNOSIS — M85852 Other specified disorders of bone density and structure, left thigh: Secondary | ICD-10-CM

## 2017-06-29 DIAGNOSIS — M19041 Primary osteoarthritis, right hand: Secondary | ICD-10-CM | POA: Diagnosis not present

## 2017-06-29 DIAGNOSIS — M19042 Primary osteoarthritis, left hand: Secondary | ICD-10-CM | POA: Diagnosis not present

## 2017-07-09 DIAGNOSIS — R05 Cough: Secondary | ICD-10-CM | POA: Diagnosis not present

## 2017-07-09 DIAGNOSIS — H109 Unspecified conjunctivitis: Secondary | ICD-10-CM | POA: Diagnosis not present

## 2017-07-30 DIAGNOSIS — H6121 Impacted cerumen, right ear: Secondary | ICD-10-CM | POA: Diagnosis not present

## 2017-07-30 DIAGNOSIS — J209 Acute bronchitis, unspecified: Secondary | ICD-10-CM | POA: Diagnosis not present

## 2017-09-13 DIAGNOSIS — S62514A Nondisplaced fracture of proximal phalanx of right thumb, initial encounter for closed fracture: Secondary | ICD-10-CM | POA: Diagnosis not present

## 2017-09-13 DIAGNOSIS — M792 Neuralgia and neuritis, unspecified: Secondary | ICD-10-CM | POA: Diagnosis not present

## 2017-09-13 DIAGNOSIS — S60011A Contusion of right thumb without damage to nail, initial encounter: Secondary | ICD-10-CM | POA: Diagnosis not present

## 2017-09-18 DIAGNOSIS — M79644 Pain in right finger(s): Secondary | ICD-10-CM | POA: Diagnosis not present

## 2017-09-19 DIAGNOSIS — H2513 Age-related nuclear cataract, bilateral: Secondary | ICD-10-CM | POA: Diagnosis not present

## 2017-09-27 DIAGNOSIS — Y999 Unspecified external cause status: Secondary | ICD-10-CM | POA: Diagnosis not present

## 2017-09-27 DIAGNOSIS — X58XXXA Exposure to other specified factors, initial encounter: Secondary | ICD-10-CM | POA: Diagnosis not present

## 2017-09-27 DIAGNOSIS — G8918 Other acute postprocedural pain: Secondary | ICD-10-CM | POA: Diagnosis not present

## 2017-09-27 DIAGNOSIS — M79641 Pain in right hand: Secondary | ICD-10-CM | POA: Diagnosis not present

## 2017-09-27 DIAGNOSIS — S62501A Fracture of unspecified phalanx of right thumb, initial encounter for closed fracture: Secondary | ICD-10-CM | POA: Diagnosis not present

## 2017-09-27 DIAGNOSIS — S62514A Nondisplaced fracture of proximal phalanx of right thumb, initial encounter for closed fracture: Secondary | ICD-10-CM | POA: Diagnosis not present

## 2017-10-11 DIAGNOSIS — S62511D Displaced fracture of proximal phalanx of right thumb, subsequent encounter for fracture with routine healing: Secondary | ICD-10-CM | POA: Diagnosis not present

## 2017-10-11 DIAGNOSIS — M79644 Pain in right finger(s): Secondary | ICD-10-CM | POA: Diagnosis not present

## 2017-10-11 DIAGNOSIS — Z4789 Encounter for other orthopedic aftercare: Secondary | ICD-10-CM | POA: Diagnosis not present

## 2017-11-12 DIAGNOSIS — S62511D Displaced fracture of proximal phalanx of right thumb, subsequent encounter for fracture with routine healing: Secondary | ICD-10-CM | POA: Diagnosis not present

## 2017-11-12 DIAGNOSIS — Z4789 Encounter for other orthopedic aftercare: Secondary | ICD-10-CM | POA: Diagnosis not present

## 2017-11-12 DIAGNOSIS — M79644 Pain in right finger(s): Secondary | ICD-10-CM | POA: Diagnosis not present

## 2017-11-15 DIAGNOSIS — M79644 Pain in right finger(s): Secondary | ICD-10-CM | POA: Diagnosis not present

## 2017-11-20 DIAGNOSIS — M79644 Pain in right finger(s): Secondary | ICD-10-CM | POA: Diagnosis not present

## 2017-11-22 DIAGNOSIS — M79644 Pain in right finger(s): Secondary | ICD-10-CM | POA: Diagnosis not present

## 2017-11-27 DIAGNOSIS — M79644 Pain in right finger(s): Secondary | ICD-10-CM | POA: Diagnosis not present

## 2017-12-04 DIAGNOSIS — M79644 Pain in right finger(s): Secondary | ICD-10-CM | POA: Diagnosis not present

## 2017-12-10 DIAGNOSIS — S62511D Displaced fracture of proximal phalanx of right thumb, subsequent encounter for fracture with routine healing: Secondary | ICD-10-CM | POA: Diagnosis not present

## 2017-12-10 DIAGNOSIS — M79644 Pain in right finger(s): Secondary | ICD-10-CM | POA: Diagnosis not present

## 2017-12-10 DIAGNOSIS — Z4789 Encounter for other orthopedic aftercare: Secondary | ICD-10-CM | POA: Diagnosis not present

## 2018-01-14 DIAGNOSIS — L820 Inflamed seborrheic keratosis: Secondary | ICD-10-CM | POA: Diagnosis not present

## 2018-01-30 DIAGNOSIS — Z0000 Encounter for general adult medical examination without abnormal findings: Secondary | ICD-10-CM | POA: Diagnosis not present

## 2018-01-30 DIAGNOSIS — F411 Generalized anxiety disorder: Secondary | ICD-10-CM | POA: Diagnosis not present

## 2018-01-30 DIAGNOSIS — Z131 Encounter for screening for diabetes mellitus: Secondary | ICD-10-CM | POA: Diagnosis not present

## 2018-01-30 DIAGNOSIS — Z86000 Personal history of in-situ neoplasm of breast: Secondary | ICD-10-CM | POA: Diagnosis not present

## 2018-01-30 DIAGNOSIS — M85852 Other specified disorders of bone density and structure, left thigh: Secondary | ICD-10-CM | POA: Diagnosis not present

## 2018-01-30 DIAGNOSIS — S93401D Sprain of unspecified ligament of right ankle, subsequent encounter: Secondary | ICD-10-CM | POA: Diagnosis not present

## 2018-01-30 DIAGNOSIS — Z23 Encounter for immunization: Secondary | ICD-10-CM | POA: Diagnosis not present

## 2018-02-05 DIAGNOSIS — Z96642 Presence of left artificial hip joint: Secondary | ICD-10-CM | POA: Diagnosis not present

## 2018-02-05 DIAGNOSIS — F411 Generalized anxiety disorder: Secondary | ICD-10-CM | POA: Diagnosis not present

## 2018-02-05 DIAGNOSIS — Z86000 Personal history of in-situ neoplasm of breast: Secondary | ICD-10-CM | POA: Diagnosis not present

## 2018-02-05 DIAGNOSIS — Z0000 Encounter for general adult medical examination without abnormal findings: Secondary | ICD-10-CM | POA: Diagnosis not present

## 2018-02-05 DIAGNOSIS — M8580 Other specified disorders of bone density and structure, unspecified site: Secondary | ICD-10-CM | POA: Diagnosis not present

## 2018-02-05 DIAGNOSIS — Z01419 Encounter for gynecological examination (general) (routine) without abnormal findings: Secondary | ICD-10-CM | POA: Diagnosis not present

## 2018-02-05 DIAGNOSIS — M19042 Primary osteoarthritis, left hand: Secondary | ICD-10-CM | POA: Diagnosis not present

## 2018-02-05 DIAGNOSIS — Z23 Encounter for immunization: Secondary | ICD-10-CM | POA: Diagnosis not present

## 2018-02-05 DIAGNOSIS — M19041 Primary osteoarthritis, right hand: Secondary | ICD-10-CM | POA: Diagnosis not present

## 2018-03-25 DIAGNOSIS — K921 Melena: Secondary | ICD-10-CM | POA: Diagnosis not present

## 2018-03-25 DIAGNOSIS — R1032 Left lower quadrant pain: Secondary | ICD-10-CM | POA: Diagnosis not present

## 2018-03-29 ENCOUNTER — Other Ambulatory Visit: Payer: Self-pay | Admitting: Family Medicine

## 2018-03-29 DIAGNOSIS — Z1231 Encounter for screening mammogram for malignant neoplasm of breast: Secondary | ICD-10-CM

## 2018-05-15 ENCOUNTER — Ambulatory Visit
Admission: RE | Admit: 2018-05-15 | Discharge: 2018-05-15 | Disposition: A | Discharge status: Home / Self Care | Payer: Medicare Other | Source: Ambulatory Visit | Attending: Family Medicine | Admitting: Family Medicine

## 2018-05-15 DIAGNOSIS — Z1231 Encounter for screening mammogram for malignant neoplasm of breast: Secondary | ICD-10-CM | POA: Diagnosis not present

## 2018-06-26 ENCOUNTER — Encounter: Payer: Self-pay | Admitting: Internal Medicine

## 2018-12-06 DIAGNOSIS — H6502 Acute serous otitis media, left ear: Secondary | ICD-10-CM | POA: Diagnosis not present

## 2018-12-06 DIAGNOSIS — J302 Other seasonal allergic rhinitis: Secondary | ICD-10-CM | POA: Diagnosis not present

## 2019-02-06 DIAGNOSIS — H524 Presbyopia: Secondary | ICD-10-CM | POA: Diagnosis not present

## 2019-02-06 DIAGNOSIS — H52223 Regular astigmatism, bilateral: Secondary | ICD-10-CM | POA: Diagnosis not present

## 2019-02-06 DIAGNOSIS — Z96642 Presence of left artificial hip joint: Secondary | ICD-10-CM | POA: Diagnosis not present

## 2019-02-06 DIAGNOSIS — E559 Vitamin D deficiency, unspecified: Secondary | ICD-10-CM | POA: Diagnosis not present

## 2019-02-06 DIAGNOSIS — H2513 Age-related nuclear cataract, bilateral: Secondary | ICD-10-CM | POA: Diagnosis not present

## 2019-02-06 DIAGNOSIS — Z0000 Encounter for general adult medical examination without abnormal findings: Secondary | ICD-10-CM | POA: Diagnosis not present

## 2019-02-06 DIAGNOSIS — Z86000 Personal history of in-situ neoplasm of breast: Secondary | ICD-10-CM | POA: Diagnosis not present

## 2019-02-06 DIAGNOSIS — M8580 Other specified disorders of bone density and structure, unspecified site: Secondary | ICD-10-CM | POA: Diagnosis not present

## 2019-02-06 DIAGNOSIS — M19041 Primary osteoarthritis, right hand: Secondary | ICD-10-CM | POA: Diagnosis not present

## 2019-02-06 DIAGNOSIS — M19042 Primary osteoarthritis, left hand: Secondary | ICD-10-CM | POA: Diagnosis not present

## 2019-02-06 DIAGNOSIS — F411 Generalized anxiety disorder: Secondary | ICD-10-CM | POA: Diagnosis not present

## 2019-02-06 DIAGNOSIS — H5203 Hypermetropia, bilateral: Secondary | ICD-10-CM | POA: Diagnosis not present

## 2019-02-18 DIAGNOSIS — Z1389 Encounter for screening for other disorder: Secondary | ICD-10-CM | POA: Diagnosis not present

## 2019-02-18 DIAGNOSIS — Z86000 Personal history of in-situ neoplasm of breast: Secondary | ICD-10-CM | POA: Diagnosis not present

## 2019-02-18 DIAGNOSIS — M8580 Other specified disorders of bone density and structure, unspecified site: Secondary | ICD-10-CM | POA: Diagnosis not present

## 2019-02-18 DIAGNOSIS — Z0000 Encounter for general adult medical examination without abnormal findings: Secondary | ICD-10-CM | POA: Diagnosis not present

## 2019-02-18 DIAGNOSIS — F411 Generalized anxiety disorder: Secondary | ICD-10-CM | POA: Diagnosis not present

## 2019-02-18 DIAGNOSIS — Z23 Encounter for immunization: Secondary | ICD-10-CM | POA: Diagnosis not present

## 2019-02-18 DIAGNOSIS — M19041 Primary osteoarthritis, right hand: Secondary | ICD-10-CM | POA: Diagnosis not present

## 2019-02-18 DIAGNOSIS — Z96642 Presence of left artificial hip joint: Secondary | ICD-10-CM | POA: Diagnosis not present

## 2019-06-20 DIAGNOSIS — Z23 Encounter for immunization: Secondary | ICD-10-CM | POA: Diagnosis not present

## 2019-06-25 DIAGNOSIS — Z1231 Encounter for screening mammogram for malignant neoplasm of breast: Secondary | ICD-10-CM | POA: Diagnosis not present

## 2019-07-19 DIAGNOSIS — Z23 Encounter for immunization: Secondary | ICD-10-CM | POA: Diagnosis not present
